# Patient Record
Sex: Female | Born: 1985 | Race: White | Hispanic: No | Marital: Single | Smoking: Never smoker
Health system: Southern US, Community
[De-identification: ages and names within clinical notes are randomized; demographics above are authoritative.]

## PROBLEM LIST (undated history)

## (undated) DIAGNOSIS — Z789 Other specified health status: Secondary | ICD-10-CM

## (undated) HISTORY — PX: TUBAL LIGATION: SHX77

## (undated) HISTORY — DX: Other specified health status: Z78.9

---
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
## 2007-03-15 ENCOUNTER — Observation Stay: Payer: Self-pay | Admitting: Obstetrics and Gynecology

## 2007-04-20 ENCOUNTER — Inpatient Hospital Stay: Payer: Self-pay | Admitting: Obstetrics and Gynecology

## 2009-11-20 ENCOUNTER — Emergency Department: Payer: Self-pay | Admitting: Emergency Medicine

## 2009-12-25 ENCOUNTER — Observation Stay: Payer: Self-pay | Admitting: Obstetrics and Gynecology

## 2009-12-25 ENCOUNTER — Inpatient Hospital Stay: Payer: Self-pay

## 2010-09-29 ENCOUNTER — Emergency Department: Payer: Self-pay | Admitting: Emergency Medicine

## 2011-04-22 ENCOUNTER — Emergency Department: Payer: Self-pay | Admitting: Emergency Medicine

## 2012-03-07 ENCOUNTER — Emergency Department: Payer: Self-pay | Admitting: *Deleted

## 2012-03-07 LAB — COMPREHENSIVE METABOLIC PANEL
Alkaline Phosphatase: 91 U/L (ref 50–136)
Anion Gap: 8 (ref 7–16)
BUN: 7 mg/dL (ref 7–18)
Bilirubin,Total: 0.4 mg/dL (ref 0.2–1.0)
Calcium, Total: 8.6 mg/dL (ref 8.5–10.1)
Co2: 27 mmol/L (ref 21–32)
Creatinine: 0.65 mg/dL (ref 0.60–1.30)
EGFR (African American): >60
Osmolality: 279 (ref 275–301)
Potassium: 3.4 mmol/L — ABNORMAL LOW (ref 3.5–5.1)
SGOT(AST): 14 U/L — ABNORMAL LOW (ref 15–37)
SGPT (ALT): 19 U/L
Total Protein: 8.1 g/dL (ref 6.4–8.2)

## 2012-03-07 LAB — URINALYSIS, COMPLETE
Blood: NEGATIVE
Ketone: NEGATIVE
Nitrite: NEGATIVE
Ph: 6 (ref 4.5–8.0)
Squamous Epithelial: 2
WBC UR: 98 /HPF (ref 0–5)

## 2012-03-07 LAB — CBC
HCT: 42.6 % (ref 35.0–47.0)
HGB: 14 g/dL (ref 12.0–16.0)
MCH: 30.4 pg (ref 26.0–34.0)
MCV: 93 fL (ref 80–100)

## 2012-03-07 LAB — LIPASE, BLOOD: Lipase: 117 U/L (ref 73–393)

## 2012-03-07 LAB — PREGNANCY, URINE: Pregnancy Test, Urine: NEGATIVE m[IU]/mL

## 2012-10-13 ENCOUNTER — Emergency Department: Payer: Self-pay | Admitting: Emergency Medicine

## 2012-10-13 LAB — URINALYSIS, COMPLETE
Bilirubin,UR: NEGATIVE
Glucose,UR: NEGATIVE mg/dL (ref 0–75)
Ph: 5 (ref 4.5–8.0)
RBC,UR: <1 /HPF (ref 0–5)
Specific Gravity: 1.036 (ref 1.003–1.030)
Squamous Epithelial: 5
WBC UR: 3 /HPF (ref 0–5)

## 2012-10-13 LAB — PREGNANCY, URINE: Pregnancy Test, Urine: NEGATIVE m[IU]/mL

## 2013-03-29 ENCOUNTER — Emergency Department: Payer: Self-pay | Admitting: Emergency Medicine

## 2013-03-30 LAB — URINALYSIS, COMPLETE
Bilirubin,UR: NEGATIVE
Glucose,UR: NEGATIVE mg/dL (ref 0–75)
Ketone: NEGATIVE
Ph: 6 (ref 4.5–8.0)
Protein: 100
RBC,UR: 4501 /HPF (ref 0–5)
Squamous Epithelial: 8
WBC UR: 14 /HPF (ref 0–5)

## 2013-03-30 LAB — BASIC METABOLIC PANEL
Anion Gap: 8 (ref 7–16)
BUN: 7 mg/dL (ref 7–18)
Calcium, Total: 8.6 mg/dL (ref 8.5–10.1)
Creatinine: 0.9 mg/dL (ref 0.60–1.30)
EGFR (African American): >60
EGFR (Non-African Amer.): >60
Glucose: 128 mg/dL — ABNORMAL HIGH (ref 65–99)
Potassium: 3.3 mmol/L — ABNORMAL LOW (ref 3.5–5.1)

## 2013-03-30 LAB — CBC
MCHC: 33 g/dL (ref 32.0–36.0)
MCV: 90 fL (ref 80–100)
Platelet: 171 10*3/uL (ref 150–440)
WBC: 10.1 10*3/uL (ref 3.6–11.0)

## 2013-03-30 LAB — HCG, QUANTITATIVE, PREGNANCY: Beta Hcg, Quant.: 31409 m[IU]/mL — ABNORMAL HIGH

## 2013-04-17 ENCOUNTER — Ambulatory Visit: Payer: Self-pay | Admitting: Family Medicine

## 2013-06-21 ENCOUNTER — Encounter: Payer: Self-pay | Admitting: Obstetrics and Gynecology

## 2013-07-11 ENCOUNTER — Ambulatory Visit: Payer: Self-pay | Admitting: Family Medicine

## 2013-09-15 ENCOUNTER — Observation Stay: Payer: Self-pay | Admitting: Obstetrics and Gynecology

## 2013-11-22 ENCOUNTER — Inpatient Hospital Stay: Payer: Self-pay | Admitting: Obstetrics and Gynecology

## 2013-11-22 LAB — CBC WITH DIFFERENTIAL/PLATELET
Basophil #: 0 10*3/uL (ref 0.0–0.1)
Basophil %: 0.4 %
Eosinophil #: 0 10*3/uL (ref 0.0–0.7)
Eosinophil %: 0.4 %
HCT: 36.8 % (ref 35.0–47.0)
HGB: 11.8 g/dL — AB (ref 12.0–16.0)
LYMPHS PCT: 17.5 %
Lymphocyte #: 1.7 10*3/uL (ref 1.0–3.6)
MCH: 25.2 pg — ABNORMAL LOW (ref 26.0–34.0)
MCHC: 32.2 g/dL (ref 32.0–36.0)
MCV: 78 fL — ABNORMAL LOW (ref 80–100)
MONO ABS: 0.4 x10 3/mm (ref 0.2–0.9)
MONOS PCT: 3.9 %
NEUTROS PCT: 77.8 %
Neutrophil #: 7.7 10*3/uL — ABNORMAL HIGH (ref 1.4–6.5)
Platelet: 164 10*3/uL (ref 150–440)
RBC: 4.7 10*6/uL (ref 3.80–5.20)
RDW: 16.2 % — ABNORMAL HIGH (ref 11.5–14.5)
WBC: 9.9 10*3/uL (ref 3.6–11.0)

## 2013-11-23 LAB — HEMOGLOBIN: HGB: 10.6 g/dL — ABNORMAL LOW (ref 12.0–16.0)

## 2014-01-31 ENCOUNTER — Ambulatory Visit: Payer: Self-pay | Admitting: Obstetrics and Gynecology

## 2014-01-31 LAB — PREGNANCY, URINE: Pregnancy Test, Urine: NEGATIVE m[IU]/mL

## 2014-01-31 LAB — HEMOGLOBIN: HGB: 12.6 g/dL (ref 12.0–16.0)

## 2014-02-01 ENCOUNTER — Ambulatory Visit: Payer: Self-pay | Admitting: Obstetrics and Gynecology

## 2014-03-19 ENCOUNTER — Emergency Department: Payer: Self-pay | Admitting: Emergency Medicine

## 2015-02-08 NOTE — Op Note (Signed)
PATIENT NAME:  Kathleen Mcdaniel, Kathleen MR#:  [id number] DATE OF BIRTH:  Dec 04, 1985  DATE OF PROCEDURE:  02/01/2014  PREOPERATIVE DIAGNOSIS: Elective permanent sterilization.   POSTOPERATIVE DIAGNOSIS: Elective permanent sterilization.   PROCEDURE: Laparoscopic bilateral tubal ligation Falope-Rings.   ANESTHESIA: General endotracheal anesthesia.   SURGEON: Suzy Bouchardhomas J. Derrika Ruffalo, MD  INDICATIONS: A 29 year old gravida 5, para 4 patient who is interested in permanent sterilization. The patient is aware of the failure rate of 1 per 200. The patient reconfirms her desire for sterilization prior to taking her to the operating room.   PROCEDURE: After adequate general endotracheal anesthesia, the patient was placed in the dorsal supine position with legs in the [place] stirrups. The patient's abdomen, perineum and vagina were prepped with Betadine and chlorhexidine. The patient was draped in normal sterile fashion. The patient's bladder was catheterized, yielding 200 mL of clear urine. A single-tooth tenaculum was applied on the anterior cervix, and a Kahn cannula was placed in the endocervical canal to be used for uterine manipulation during the procedure. A 12 mm infraumbilical incision was made after injecting with 0.5% Marcaine. The laparoscope was advanced into the abdominal cavity under direct visualization with the Optiview cannula. The patient's abdomen was insufflated with carbon dioxide, and a second port was placed 2 cm above the symphysis pubis. The Falope-Ring trocar was advanced into the abdominal cavity under direct visualization. The patient's right fallopian tube was identified, and a Falope-Ring was placed at the isthmic ampullary portion of fallopian tube. Picture was taken. A similar procedure was repeated on the patient's left fallopian tube. Again, a good 1.5 cm knuckle of fallopian tube was incorporated in the Falope-Ring bilaterally. Picture was taken. There were some small filmy  adhesions on the right sidewall which were not removed. Upper abdomen appeared normal. Good hemostasis was noted. Ovaries appeared normal. The patient's abdomen was deflated, and the infraumbilical incision was closed with a deep 2-0 Vicryl, and all the other incisions were closed with interrupted 4-0 Vicryl sutures. Steri-Strips applied. Sterile dressing applied. The Kahn cannula and the single-tooth tenaculum were removed, and silver nitrate was used at the tenacula sites. Good hemostasis was noted. There were no complications. Estimated blood loss minimal. Intraoperative fluids 700 mL. The patient was taken to the recovery room in good condition.   ____________________________ Suzy Bouchardhomas J. Masayo Fera, MD tjs:lb D: 02/01/2014 09:58:51 ET T: 02/01/2014 10:47:06 ET JOB#: [id number]  cc: Suzy Bouchardhomas J. Kaiyden Simkin, MD, <Dictator> Suzy BouchardHOMAS J Kikue Gerhart MD ELECTRONICALLY SIGNED 02/01/2014 13:16

## 2015-02-25 NOTE — H&P (Signed)
L&D Evaluation:  History:  HPI 29 y/o para 4 at [id number]   Presents with contractions   Patient's Surgical History none   Medications Pre Natal Vitamins   Allergies NKDA   Social History none   Family History Non-Contributory   ROS:  ROS All systems were reviewed.  HEENT, CNS, GI, GU, Respiratory, CV, Renal and Musculoskeletal systems were found to be normal.   Exam:  Vital Signs stable   General no apparent distress   Abdomen gravid, tender with contractions   Estimated Fetal Weight Average for gestational age   Back no CVAT   Pelvic 7/95/-1   FHT normal rate with no decels   Ucx regular   Ucx Frequency 5 min   Impression:  Impression active labor   Plan:  Comments arom clear antic svd GBS +; approx 2 hrs s/p 1st dose Amox   Electronic Signatures: Margaretha GlassingEvans, Ricky L (MD)  (Signed 05-Feb-15 12:18)  Authored: L&D Evaluation   Last Updated: 05-Feb-15 12:18 by Margaretha GlassingEvans, Ricky L (MD)

## 2015-05-27 ENCOUNTER — Encounter: Payer: Self-pay | Admitting: Emergency Medicine

## 2015-05-27 DIAGNOSIS — Z3202 Encounter for pregnancy test, result negative: Secondary | ICD-10-CM | POA: Insufficient documentation

## 2015-05-27 DIAGNOSIS — R1084 Generalized abdominal pain: Secondary | ICD-10-CM | POA: Insufficient documentation

## 2015-05-27 DIAGNOSIS — T475X5A Adverse effect of digestants, initial encounter: Secondary | ICD-10-CM | POA: Insufficient documentation

## 2015-05-27 LAB — COMPREHENSIVE METABOLIC PANEL
ALT: 12 U/L — ABNORMAL LOW (ref 14–54)
AST: 18 U/L (ref 15–41)
Albumin: 4 g/dL (ref 3.5–5.0)
Alkaline Phosphatase: 77 U/L (ref 38–126)
Anion gap: 9 (ref 5–15)
BUN: 14 mg/dL (ref 6–20)
CHLORIDE: 106 mmol/L (ref 101–111)
CO2: 26 mmol/L (ref 22–32)
CREATININE: 0.69 mg/dL (ref 0.44–1.00)
Calcium: 9 mg/dL (ref 8.9–10.3)
GFR calc non Af Amer: >60 mL/min (ref >60)
GLUCOSE: 92 mg/dL (ref 65–99)
POTASSIUM: 3.9 mmol/L (ref 3.5–5.1)
SODIUM: 141 mmol/L (ref 135–145)
TOTAL PROTEIN: 7.3 g/dL (ref 6.5–8.1)
Total Bilirubin: 0.2 mg/dL — ABNORMAL LOW (ref 0.3–1.2)

## 2015-05-27 LAB — LIPASE, BLOOD: LIPASE: 35 U/L (ref 22–51)

## 2015-05-27 LAB — URINALYSIS COMPLETE WITH MICROSCOPIC (ARMC ONLY)
Bacteria, UA: NONE SEEN
Bilirubin Urine: NEGATIVE
GLUCOSE, UA: NEGATIVE mg/dL
HGB URINE DIPSTICK: NEGATIVE
KETONES UR: NEGATIVE mg/dL
NITRITE: NEGATIVE
PH: 6 (ref 5.0–8.0)
PROTEIN: NEGATIVE mg/dL
Specific Gravity, Urine: 1.019 (ref 1.005–1.030)

## 2015-05-27 LAB — CBC
HCT: 40.9 % (ref 35.0–47.0)
HEMOGLOBIN: 13.6 g/dL (ref 12.0–16.0)
MCH: 28.9 pg (ref 26.0–34.0)
MCHC: 33.2 g/dL (ref 32.0–36.0)
MCV: 86.8 fL (ref 80.0–100.0)
Platelets: 145 10*3/uL — ABNORMAL LOW (ref 150–440)
RBC: 4.72 MIL/uL (ref 3.80–5.20)
RDW: 13.9 % (ref 11.5–14.5)
WBC: 8.7 10*3/uL (ref 3.6–11.0)

## 2015-05-27 LAB — POCT PREGNANCY, URINE: PREG TEST UR: NEGATIVE

## 2015-05-27 NOTE — ED Notes (Signed)
Patient ambulatory to triage with steady gait, without difficulty or distress noted pt reports Sunday took garcinia cambogia; since having left lower abd pain with bloating, N/V and HA,

## 2015-05-28 ENCOUNTER — Emergency Department
Admission: EM | Admit: 2015-05-28 | Discharge: 2015-05-28 | Disposition: A | Discharge status: Home / Self Care | Payer: Self-pay | Attending: Emergency Medicine | Admitting: Emergency Medicine

## 2015-05-28 DIAGNOSIS — T50905A Adverse effect of unspecified drugs, medicaments and biological substances, initial encounter: Secondary | ICD-10-CM

## 2015-05-28 DIAGNOSIS — R1084 Generalized abdominal pain: Secondary | ICD-10-CM

## 2015-05-28 MED ORDER — DICYCLOMINE HCL 10 MG PO CAPS: 10.0000 mg | ORAL_CAPSULE | Freq: Three times a day (TID) | ORAL | Status: AC

## 2015-05-28 MED ORDER — DICYCLOMINE HCL 10 MG PO CAPS
10.0000 mg | ORAL_CAPSULE | Freq: Once | ORAL | Status: AC
  Administered 2015-05-28: 10 mg via ORAL
  Filled 2015-05-28: qty 1

## 2015-05-28 NOTE — ED Provider Notes (Signed)
[facility] Emergency Department Provider Note  ____________________________________________  Time seen: 2:00 AM  I have reviewed the triage vital signs and the nursing notes.   HISTORY  Chief Complaint Abdominal Pain and Emesis     HPI Kathleen Mcdaniel is a 29 y.o. female presents with generalized abdominal pain one after taking Garcinia Cambogia on Sunday. Patient also admits to generalized abdominal bloating following taking the tablet for weight loss purposes. Patient denies any fever no nausea or vomiting     Past medical history None There are no active problems to display for this patient.   Past Surgical History  Procedure Laterality Date  . Tubal ligation      No current outpatient prescriptions on file.  Allergies None No family history on file.  Social History Social History  Substance Use Topics  . Smoking status: Never Smoker   . Smokeless tobacco: None  . Alcohol Use: No    Review of Systems  Constitutional: Negative for fever. Eyes: Negative for visual changes. ENT: Negative for sore throat. Cardiovascular: Negative for chest pain. Respiratory: Negative for shortness of breath. Gastrointestinal: Positive abdominal cramps Genitourinary: Negative for dysuria. Musculoskeletal: Negative for back pain. Skin: Negative for rash. Neurological: Negative for headaches, focal weakness or numbness.  10-point ROS otherwise negative.  ____________________________________________   PHYSICAL EXAM:  VITAL SIGNS: ED Triage Vitals  Enc Vitals Group     BP 05/27/15 2128 130/77 mmHg     Pulse Rate 05/27/15 2128 90     Resp 05/27/15 2128 20     Temp 05/27/15 2128 98 F (36.7 C)     Temp Source 05/27/15 2128 Oral     SpO2 05/27/15 2128 100 %     Weight 05/27/15 2128 145 lb (65.772 kg)     Height 05/27/15 2128  (1.676 m)     Head Cir --      Peak Flow --      Pain Score 05/27/15 2128 9     Pain Loc --      Pain  Edu? --      Excl. in GC? --     Constitutional: Alert and oriented. Well appearing and in no distress. Eyes: Conjunctivae are normal. PERRL. Normal extraocular movements. ENT   Head: Normocephalic and atraumatic.   Nose: No congestion/rhinnorhea.   Mouth/Throat: Mucous membranes are moist.   Neck: No stridor. Cardiovascular: Normal rate, regular rhythm. Normal and symmetric distal pulses are present in all extremities. No murmurs, rubs, or gallops. Respiratory: Normal respiratory effort without tachypnea nor retractions. Breath sounds are clear and equal bilaterally. No wheezes/rales/rhonchi. Gastrointestinal: Soft and nontender. No distention. There is no CVA tenderness. Genitourinary: deferred Musculoskeletal: Nontender with normal range of motion in all extremities. No joint effusions.  No lower extremity tenderness nor edema. Neurologic:  Normal speech and language. No gross focal neurologic deficits are appreciated. Speech is normal.  Skin:  Skin is warm, dry and intact. No rash noted. Psychiatric: Mood and affect are normal. Speech and behavior are normal. Patient exhibits appropriate insight and judgment.  ____________________________________________    LABS (pertinent positives/negatives)  Labs Reviewed  COMPREHENSIVE METABOLIC PANEL - Abnormal; Notable for the following:    ALT 12 (*)    Total Bilirubin 0.2 (*)    All other components within normal limits  CBC - Abnormal; Notable for the following:    Platelets 145 (*)    All other components within normal limits  URINALYSIS COMPLETEWITH MICROSCOPIC ([facility] ONLY) -  Abnormal; Notable for the following:    Color, Urine YELLOW (*)    APPearance CLEAR (*)    Leukocytes, UA 1+ (*)    Squamous Epithelial / LPF 0-5 (*)    All other components within normal limits  LIPASE, BLOOD  POC URINE PREG, ED  POCT PREGNANCY, URINE       INITIAL IMPRESSION / ASSESSMENT AND PLAN / ED COURSE  Pertinent labs & imaging  results that were available during my care of the patient were reviewed by me and considered in my medical decision making (see chart for details).  History of physical exam consistent with abdominal discomfort secondary to medication side effect  ____________________________________________   FINAL CLINICAL IMPRESSION(S) / ED DIAGNOSES  Final diagnoses:  Medication side effect, initial encounter  Generalized abdominal cramps      Darci Current, MD 05/28/15 [phone]

## 2015-05-28 NOTE — Discharge Instructions (Signed)

## 2015-07-13 ENCOUNTER — Emergency Department (HOSPITAL_COMMUNITY)
Admission: EM | Admit: 2015-07-13 | Discharge: 2015-07-13 | Disposition: A | Discharge status: Home / Self Care | Payer: Self-pay | Attending: Emergency Medicine | Admitting: Emergency Medicine

## 2015-07-13 ENCOUNTER — Encounter (HOSPITAL_COMMUNITY): Payer: Self-pay | Admitting: *Deleted

## 2015-07-13 DIAGNOSIS — R05 Cough: Secondary | ICD-10-CM | POA: Insufficient documentation

## 2015-07-13 DIAGNOSIS — R1110 Vomiting, unspecified: Secondary | ICD-10-CM | POA: Insufficient documentation

## 2015-07-13 DIAGNOSIS — R1084 Generalized abdominal pain: Secondary | ICD-10-CM | POA: Insufficient documentation

## 2015-07-13 DIAGNOSIS — Z79899 Other long term (current) drug therapy: Secondary | ICD-10-CM | POA: Insufficient documentation

## 2015-07-13 DIAGNOSIS — Z3202 Encounter for pregnancy test, result negative: Secondary | ICD-10-CM | POA: Insufficient documentation

## 2015-07-13 DIAGNOSIS — N898 Other specified noninflammatory disorders of vagina: Secondary | ICD-10-CM | POA: Insufficient documentation

## 2015-07-13 LAB — URINALYSIS, ROUTINE W REFLEX MICROSCOPIC
Bilirubin Urine: NEGATIVE
GLUCOSE, UA: NEGATIVE mg/dL
HGB URINE DIPSTICK: NEGATIVE
Ketones, ur: NEGATIVE mg/dL
Leukocytes, UA: NEGATIVE
Nitrite: NEGATIVE
PH: 6.5 (ref 5.0–8.0)
PROTEIN: NEGATIVE mg/dL
Urobilinogen, UA: 0.2 mg/dL (ref 0.0–1.0)

## 2015-07-13 LAB — PREGNANCY, URINE: PREG TEST UR: NEGATIVE

## 2015-07-13 LAB — WET PREP, GENITAL
Trich, Wet Prep: NONE SEEN
Yeast Wet Prep HPF POC: NONE SEEN

## 2015-07-13 MED ORDER — ACETAMINOPHEN 325 MG PO TABS
650.0000 mg | ORAL_TABLET | Freq: Once | ORAL | Status: AC
  Administered 2015-07-13: 650 mg via ORAL
  Filled 2015-07-13: qty 2

## 2015-07-13 MED ORDER — ONDANSETRON 8 MG PO TBDP
8.0000 mg | ORAL_TABLET | Freq: Once | ORAL | Status: AC
  Administered 2015-07-13: 8 mg via ORAL
  Filled 2015-07-13: qty 1

## 2015-07-13 NOTE — ED Notes (Signed)
Pelvic being done by EDP

## 2015-07-13 NOTE — Discharge Instructions (Signed)

## 2015-07-13 NOTE — ED Provider Notes (Signed)
CSN: [id number]     Arrival date & time 07/13/15  1153 History  This chart was scribed for Zadie Rhine, MD by Murriel Hopper, ED Scribe. This patient was seen in room APA01/APA01 and the patient's care was started at 12:26 PM.    Chief Complaint  Patient presents with  . Abdominal Pain      The history is provided by the patient. No language interpreter was used.    HPI Comments: Kathleen Mcdaniel is a 29 y.o. female who presents to the Emergency Department complaining of constant, cramping lower abdominal pain with associated odorous clear vaginal discharge and vomiting that has been present for 5-6 days. Pt states she began vomiting two days ago. Pt states the amount of discharge she is having is normal. Pt reports her last period was a week and a half ago. Pt reports hx of chlamydia a few years ago and states she is sexually active with one partner and she currently has no concerns for STD. Pt denies dysuria, diarrhea. Pt also reports she has been coughing intermittently in the last couple of days.    PMH - none  Past Surgical History  Procedure Laterality Date  . Tubal ligation     No family history on file. Social History  Substance Use Topics  . Smoking status: Never Smoker   . Smokeless tobacco: None  . Alcohol Use: No   OB History    No data available     Review of Systems  Respiratory: Positive for cough.   Gastrointestinal: Positive for vomiting and abdominal pain. Negative for diarrhea.  Genitourinary: Positive for vaginal discharge. Negative for dysuria.  All other systems reviewed and are negative.     Allergies  Review of patient's allergies indicates no known allergies.  Home Medications   Prior to Admission medications   Medication Sig Start Date End Date Taking? Authorizing Provider  dicyclomine (BENTYL) 10 MG capsule Take 1 capsule (10 mg total) by mouth 3 (three) times daily before meals. 05/28/15   Darci Current, MD   BP 112/83 mmHg   Pulse 81  Temp(Src) 97.9 F (36.6 C) (Oral)  Resp 18  Ht  (1.651 m)  Wt 140 lb (63.504 kg)  BMI 23.30 kg/m2  SpO2 98%  LMP 06/29/2015 Physical Exam  Nursing note and vitals reviewed.  CONSTITUTIONAL: Well developed/well nourished HEAD: Normocephalic/atraumatic EYES: EOMI/PERRL ENMT: Mucous membranes moist NECK: supple no meningeal signs SPINE/BACK diffuse lumbar paraspinal tenderness CV: S1/S2 noted, no murmurs/rubs/gallops noted LUNGS: Lungs are clear to auscultation bilaterally, no apparent distress ABDOMEN: soft, nontender, no rebound or guarding, bowel sounds noted throughout abdomen GU:no cva tenderness. Yellowish/white discharge noted, no cmt, no adnexal tenderness/mass.  Female chaperone present NEURO: Pt is awake/alert/appropriate, moves all extremitiesx4.  No facial droop.   EXTREMITIES: pulses normal/equal, full ROM SKIN: warm, color normal PSYCH: no abnormalities of mood noted, alert and oriented to situation   ED Course  Procedures   DIAGNOSTIC STUDIES: Oxygen Saturation is 98% on room air, normal by my interpretation.    COORDINATION OF CARE: 12:27 PM Discussed treatment plan with pt at bedside and pt agreed to plan.   Labs Review Labs Reviewed  WET PREP, GENITAL - Abnormal; Notable for the following:    Clue Cells Wet Prep HPF POC FEW (*)    WBC, Wet Prep HPF POC FEW (*)    All other components within normal limits  URINALYSIS, ROUTINE W REFLEX MICROSCOPIC (NOT AT [facility]) - Abnormal; Notable for  the following:    Specific Gravity, Urine <1.005 (*)    All other components within normal limits  PREGNANCY, URINE  POC URINE PREG, ED  GC/CHLAMYDIA PROBE AMP ([place]) NOT AT [facility]    I have personally reviewed and evaluated these lab results as part of my medical decision-making.   Pt well appearing Advised f/u with GYN Advised to avoid sexual contact until all symptoms resolved and labs tests are negative    Medications  ondansetron  (ZOFRAN-ODT) disintegrating tablet 8 mg (8 mg Oral Given 07/13/15 1231)  acetaminophen (TYLENOL) tablet 650 mg (650 mg Oral Given 07/13/15 1231)    MDM   Final diagnoses:  None    Nursing notes including past medical history and social history reviewed and considered in documentation Labs/vital reviewed myself and considered during evaluation   I personally performed the services described in this documentation, which was scribed in my presence. The recorded information has been reviewed and is accurate.      Zadie Rhine, MD 07/13/15 1339

## 2015-07-13 NOTE — ED Notes (Addendum)
Pt states lower abdominal pain, described as cramping. Pt states she has also noticed malodorous vaginal discharge. States discharge is clear but there is "a lot" of it. Denies itching or irritation. States vomiting began two days ago.

## 2015-07-14 LAB — GC/CHLAMYDIA PROBE AMP (~~LOC~~) NOT AT ARMC
Chlamydia: NEGATIVE
Neisseria Gonorrhea: NEGATIVE

## 2015-08-02 ENCOUNTER — Emergency Department (HOSPITAL_COMMUNITY)
Admission: EM | Admit: 2015-08-02 | Discharge: 2015-08-02 | Disposition: A | Discharge status: Home / Self Care | Payer: Self-pay | Attending: Emergency Medicine | Admitting: Emergency Medicine

## 2015-08-02 ENCOUNTER — Emergency Department (HOSPITAL_COMMUNITY): Payer: Self-pay

## 2015-08-02 ENCOUNTER — Encounter (HOSPITAL_COMMUNITY): Payer: Self-pay | Admitting: Emergency Medicine

## 2015-08-02 DIAGNOSIS — Z9851 Tubal ligation status: Secondary | ICD-10-CM | POA: Insufficient documentation

## 2015-08-02 DIAGNOSIS — Z3202 Encounter for pregnancy test, result negative: Secondary | ICD-10-CM | POA: Insufficient documentation

## 2015-08-02 DIAGNOSIS — R1084 Generalized abdominal pain: Secondary | ICD-10-CM | POA: Insufficient documentation

## 2015-08-02 DIAGNOSIS — R109 Unspecified abdominal pain: Secondary | ICD-10-CM

## 2015-08-02 LAB — CBC WITH DIFFERENTIAL/PLATELET
Basophils Absolute: 0 10*3/uL (ref 0.0–0.1)
Basophils Relative: 1 %
EOS PCT: 1 %
Eosinophils Absolute: 0.1 10*3/uL (ref 0.0–0.7)
HEMATOCRIT: 42.7 % (ref 36.0–46.0)
Hemoglobin: 14 g/dL (ref 12.0–15.0)
LYMPHS PCT: 20 %
Lymphs Abs: 1.7 10*3/uL (ref 0.7–4.0)
MCH: 29.8 pg (ref 26.0–34.0)
MCHC: 32.8 g/dL (ref 30.0–36.0)
MCV: 90.9 fL (ref 78.0–100.0)
MONOS PCT: 7 %
Monocytes Absolute: 0.6 10*3/uL (ref 0.1–1.0)
NEUTROS ABS: 5.9 10*3/uL (ref 1.7–7.7)
Neutrophils Relative %: 71 %
PLATELETS: 183 10*3/uL (ref 150–400)
RBC: 4.7 MIL/uL (ref 3.87–5.11)
RDW: 14.2 % (ref 11.5–15.5)
WBC: 8.2 10*3/uL (ref 4.0–10.5)

## 2015-08-02 LAB — COMPREHENSIVE METABOLIC PANEL
ALT: 13 U/L — ABNORMAL LOW (ref 14–54)
AST: 15 U/L (ref 15–41)
Albumin: 4.3 g/dL (ref 3.5–5.0)
Alkaline Phosphatase: 66 U/L (ref 38–126)
Anion gap: 7 (ref 5–15)
BUN: 14 mg/dL (ref 6–20)
CHLORIDE: 106 mmol/L (ref 101–111)
CO2: 29 mmol/L (ref 22–32)
Calcium: 9.2 mg/dL (ref 8.9–10.3)
Creatinine, Ser: 0.74 mg/dL (ref 0.44–1.00)
GFR calc Af Amer: >60 mL/min (ref >60)
GFR calc non Af Amer: >60 mL/min (ref >60)
GLUCOSE: 88 mg/dL (ref 65–99)
POTASSIUM: 3.6 mmol/L (ref 3.5–5.1)
Sodium: 142 mmol/L (ref 135–145)
Total Bilirubin: 0.8 mg/dL (ref 0.3–1.2)
Total Protein: 7.8 g/dL (ref 6.5–8.1)

## 2015-08-02 LAB — URINALYSIS, ROUTINE W REFLEX MICROSCOPIC
Bilirubin Urine: NEGATIVE
Glucose, UA: NEGATIVE mg/dL
HGB URINE DIPSTICK: NEGATIVE
Ketones, ur: NEGATIVE mg/dL
Nitrite: NEGATIVE
PROTEIN: NEGATIVE mg/dL
SPECIFIC GRAVITY, URINE: 1.025 (ref 1.005–1.030)
Urobilinogen, UA: 0.2 mg/dL (ref 0.0–1.0)
pH: 6 (ref 5.0–8.0)

## 2015-08-02 LAB — URINE MICROSCOPIC-ADD ON

## 2015-08-02 LAB — POC URINE PREG, ED: Preg Test, Ur: NEGATIVE

## 2015-08-02 LAB — LIPASE, BLOOD: Lipase: 37 U/L (ref 22–51)

## 2015-08-02 MED ORDER — IOHEXOL 300 MG/ML  SOLN
100.0000 mL | Freq: Once | INTRAMUSCULAR | Status: AC | PRN
  Administered 2015-08-02: 100 mL via INTRAVENOUS

## 2015-08-02 MED ORDER — ONDANSETRON HCL 4 MG/2ML IJ SOLN
4.0000 mg | INTRAMUSCULAR | Status: DC | PRN
  Administered 2015-08-02: 4 mg via INTRAVENOUS
  Filled 2015-08-02: qty 2

## 2015-08-02 MED ORDER — SODIUM CHLORIDE 0.9 % IV BOLUS (SEPSIS)
1000.0000 mL | Freq: Once | INTRAVENOUS | Status: AC
  Administered 2015-08-02: 1000 mL via INTRAVENOUS

## 2015-08-02 MED ORDER — ONDANSETRON HCL 4 MG PO TABS: 4.0000 mg | ORAL_TABLET | Freq: Three times a day (TID) | ORAL | Status: AC | PRN

## 2015-08-02 MED ORDER — IOHEXOL 300 MG/ML  SOLN
25.0000 mL | Freq: Once | INTRAMUSCULAR | Status: AC | PRN
  Administered 2015-08-02: 25 mL via ORAL

## 2015-08-02 MED ORDER — DICYCLOMINE HCL 10 MG/ML IM SOLN
20.0000 mg | Freq: Once | INTRAMUSCULAR | Status: AC
  Administered 2015-08-02: 20 mg via INTRAMUSCULAR
  Filled 2015-08-02: qty 2

## 2015-08-02 MED ORDER — DICYCLOMINE HCL 20 MG PO TABS: 20.0000 mg | ORAL_TABLET | Freq: Four times a day (QID) | ORAL | Status: AC | PRN

## 2015-08-02 NOTE — Discharge Instructions (Signed)
°Emergency Department Resource Guide °1) Find a Doctor and Pay Out of Pocket °Although you won't have to find out who is covered by your insurance plan, it is a good idea to ask around and get recommendations. You will then need to call the office and see if the doctor you have chosen will accept you as a new patient and what types of options they offer for patients who are self-pay. Some doctors offer discounts or will set up payment plans for their patients who do not have insurance, but you will need to ask so you aren't surprised when you get to your appointment. ° °2) Contact Your Local Health Department °Not all health departments have doctors that can see patients for sick visits, but many do, so it is worth a call to see if yours does. If you don't know where your local health department is, you can check in your phone book. The CDC also has a tool to help you locate your state's health department, and many state websites also have listings of all of their local health departments. ° °3) Find a Walk-in Clinic °If your illness is not likely to be very severe or complicated, you may want to try a walk in clinic. These are popping up all over the country in pharmacies, drugstores, and shopping centers. They're usually staffed by nurse practitioners or physician assistants that have been trained to treat common illnesses and complaints. They're usually fairly quick and inexpensive. However, if you have serious medical issues or chronic medical problems, these are probably not your best option. ° °No Primary Care Doctor: °- Call [facility] at  832-8000 - they can help you locate a primary care doctor that  accepts your insurance, provides certain services, etc. °- Physician Referral Service- 1-800-533-3463 ° °Chronic Pain Problems: °Organization         Address  Phone   Notes  °[facility]  ([phone]) [phone] Patients need to be referred by their primary care doctor.  ° °Medication  Assistance: °Organization         Address  Phone   Notes  °[place] [facility] [address] °[place], [place] [address] (336) 641-8030 --Must be a resident of [place] °-- Must have NO insurance coverage whatsoever (no Medicaid/ Medicare, etc.) °-- The pt. MUST have a primary care doctor that directs their care regularly and follows them in the community °  °[facility]  (866) 331-1348   °[employer]  (888) 892-1162   ° °Agencies that provide inexpensive medical care: °Organization         Address  Phone   Notes  °[facility]  (336) 832-8035   °[facility]    (336) 832-7272   °[facility] [address] °[place], [place] [address] (336) 832-4777   °[facility] [address], °[place] (336) 271-4999   °[facility]    (336) 373-0678   °[facility]    (336) 272-1050   °[facility] ° [address], [place] Phone:  (336) 832-4444, Fax:  (336) 832-4440 Hours of Operation:  9 am - 6 pm, M-F.  Also accepts Medicaid/Medicare and self-pay.  °[facility] ° [address], [place] Phone: (336) [phone], Fax: (336) [phone]. Hours of Operation:  8:30 am - 5:30 pm, M-F.  Also accepts Medicaid and self-pay.  °[facility] [place] [address], [place] Phone: (336) 878-6027   °[facility] [address], [place], [place] (336)723-1848, Ext. 123 Mondays & Thursdays: 7-9 AM.  First 15 patients are seen on a first come, first serve basis. °  ° °Medicaid-accepting [place] Providers: ° °Organization         Address  Phone   Notes  °[facility] [address], [place] (336) 641-2100 Also accepts self-pay patients.  °[facility] [address], [place] ° (336) 856-9996   °[facility] [address], [place]  (336) 288-8857   °[facility] [address] [place] [address], [place] (336) 299-7000   °Veita Bland [address], [place]  ° (336) 373-1557 Only accepts [place] Access Medicaid patients after they have their name applied to their card.  ° °Self-Pay (no insurance) in [place]: ° °Organization         Address  Phone   Notes  °Sickle Cell Patients, [facility] [address], [place] (336) 832-1970   °[facility] [address], [place] (336) 832-4400   °[facility] ° [address], [place] (336) 992-4800   °[facility]/Dr. Osei-Bonsu ° [address] [place] [address], [place] or [address], [place] (336) [phone] Phone number for both [place] and [place] locations is the same.  °[facility] [address], [place] (336) 299-0000   °[facility] [address] [place] [address], [place] or [address] (336) 852-7530 °(336) 878-2260   °[facility] [address], [place] (336) 350-1642, phone; (336) 294-5005, fax Sees patients 1st and 3rd Saturday of every month.  Must not qualify for public or private insurance (i.e. Medicaid, Medicare, [place] Health Choice, Veterans' Benefits) • Household income should be no more than 200% of the poverty level •The clinic cannot treat you if you are pregnant or think you are pregnant • Sexually transmitted diseases are not treated at the clinic.  ° ° °Dental Care: °Organization         Address  Phone  Notes  °[place] [facility] [address], [place] (336) 641-6152 Accepts children up to age 21 who are enrolled in Medicaid or [place] Health Choice; pregnant women with a Medicaid card; and children who have applied for Medicaid or [place] Health Choice, but were declined, whose parents can pay a reduced fee at time of service.  °[place]  [facility] [place]  [address], [place] (336) 641-7733 Accepts children up to age 21 who are enrolled in Medicaid or [place] Health Choice; pregnant women with a Medicaid card; and children who have applied for Medicaid or [place] Health Choice, but were declined, whose parents can pay a reduced fee at time of service.  °[facility] ° [address], [place] (336) 641-4533 Patients are seen by appointment only. Walk-ins are not accepted. [facility] will see patients 18 years of age and older. °Monday - Tuesday (8am-5pm) °Most Wednesdays (8:30-5pm) °$30 per visit, cash only  °[facility] ° [address], [place] (336) 641-4533 Patients are seen by appointment only. Walk-ins are not accepted. [facility] will see patients 18 years of age and older. °One   Wednesday Evening (Monthly: Volunteer Based).  $30 per visit, cash only  °[facility]  (919) [phone] for adults; Children under age 4, call Graduate Pediatric Dentistry at ([phone]) [phone]. Children aged 4-14, please call ([phone]) [phone] to request a pediatric application. ° Dental services are provided in all areas of dental care including fillings, crowns and bridges, complete and partial dentures, implants, gum treatment, root canals, and extractions. Preventive care is also provided. Treatment is provided to both adults and children. °Patients are selected via a lottery and there is often a waiting list. °  °[facility] [address], °[place] ° (336) 763-8833 [email or web address] °  °[facility] [address], [place], [place] (336)723-1848, Ext. 123 Second and Fourth Thursday of each month, opens at 6:30 AM; Clinic ends at 9 AM.  Patients are seen on a first-come first-served basis, and a limited number are seen during each clinic.  ° °[facility] ° [address], [place], [place] (336) 723-7904    Eligibility Requirements °You must have lived in [place], [place], or [place] counties for at least the last three months. °  You cannot be eligible for state or federal sponsored healthcare insurance, including Veterans Administration, Medicaid, or Medicare. °  You generally cannot be eligible for healthcare insurance through your employer.  °  How to apply: °Eligibility screenings are held every Tuesday and Wednesday afternoon from 1:00 pm until 4:00 pm. You do not need an appointment for the interview!  °[facility] [address], [place], [place] 336-631-2330   °[facility]  336-342-8273   °[facility]  336-703-3100   °[facility]  336-570-6415   ° °Behavioral Health Resources in the Community: °Intensive Outpatient Programs °Organization         Address  Phone  Notes  °[facility] [address], [place], [place] 336-878-6098   °[facility] [address], [place], [place] 336-832-9800   °[facility] [address], [place], [place] ° 336-882-2125   °[facility] [address],  °[place], [place] 1-800-853-5163 or 336-641-4981   °Substance Abuse Resources °Organization         Address  Phone  Notes  °[facility]  336-882-2125   °[facility]  336-784-9470   °[facility]  336-285-9073   °[facility]  336-845-3988   °Residential & Outpatient Substance Abuse Program  1-800-659-3381   °Psychological Services °Organization         Address  Phone  Notes  °[facility]  336- 832-9600   °[facility]  336- 378-7881   °[facility] [address], [place] 1-800-853-5163 or 336-641-4981   ° °Mobile Crisis Teams °Organization         Address  Phone  Notes  °[facility], Mobile Crisis Care Unit  1-877-626-1772   °Assertive °[facility] ° [address]  [place], [place] 336-834-9664   °Sharon DeEsch [address] °[place] 336-554-5454   ° °Self-Help/Support Groups °Organization         Address  Phone             Notes  °[facility]  - variety of support groups  336- 373-1402 Call for more information  °Narcotics Anonymous (NA), [facility] [address], °[place]  2 meetings at this location  ° °  Residential Treatment Programs Organization         Address  Phone  Notes  [facility] [address],    [place]  [phone]   [facility]  [address], [place] [phone], [place], [place] [phone]   [facility] [address], [place] [phone] Admissions: 8am-3pm M-F  [facility] [address]    [place], [place] [phone]   [facility] [address], [place], [place] [phone]   [facility] [address]  [place], [place] [phone]   [facility] Intensive Outpatient [address], [place], [place] [phone]   [facility] [address]  [place], [place] [phone] or [phone]   [facility] ([facility] [address] [place], [place] [phone] Accepts Medicaid  [facility] [address]  [place] [phone] Substance Abuse/Addiction Treatment   [facility]         Address  Phone  Notes  [facility]  ([phone])[phone](888) [phone]   Angie FavaJulie Brannon, PhD [address], [address] [place], [place]   [phone]) [phone] or [phone](336) [phone]   [facility]   [address] [place], [place] [phone]) [phone]   [facility] [address], [place], [place] [phone](336) [phone] Insurance/Medicaid/sponsorship through [facility]  [facility] [address]                                    [place], [place] [phone]) [phone] Therapy/tele-psych/case    [facility] [address]   [place], [place] [phone](336) [phone]    Dr. Lolly MustacheArfeen  [phone]) [phone]   [facility] [place]  [employer] [facility] 1) [address], [place] 2) [address], [place] 3)  [address], [place] [phone](336) [phone]) [phone]  [phone]) [phone]   [facility] Child Abuse Hotline 631-888-2651(336) [phone] or [phone](336) [phone] (After Hours)      Take the prescriptions as directed.  Call your regular medical doctor, your OB/GYN doctor, and the GI doctor on Monday to schedule a follow up appointment within the next week.  Return to the Emergency Department immediately sooner if worsening.

## 2015-08-02 NOTE — ED Notes (Signed)
Pt reports upper and lower abd pain x 2 weeks with nausea.

## 2015-08-02 NOTE — ED Provider Notes (Signed)
CSN: [id number]     Arrival date & time 08/02/15  1846 History   First MD Initiated Contact with Patient 08/02/15 1855     Chief Complaint  Patient presents with  . Abdominal Pain      HPI  Pt was seen at 1920.  Per pt, c/o gradual onset and persistence of constant generalized abd "pain" for the past 2 weeks.  Has been associated with multiple intermittent episodes of N/V.  Describes the abd pain as "cramping."  Denies diarrhea, no fevers, no back pain, no rash, no CP/SOB, no black or blood in stools or emesis. The symptoms have been associated with no other complaints. The patient has a significant history of similar symptoms previously, recently being evaluated for this complaint and multiple prior evals for same. This is pt's 3rd ED visit in 3 months for abd pain complaint.    History reviewed. No pertinent past medical history.   Past Surgical History  Procedure Laterality Date  . Tubal ligation      Social History  Substance Use Topics  . Smoking status: Never Smoker   . Smokeless tobacco: None  . Alcohol Use: No    Review of Systems ROS: Statement: All systems negative except as marked or noted in the HPI; Constitutional: Negative for fever and chills. ; ; Eyes: Negative for eye pain, redness and discharge. ; ; ENMT: Negative for ear pain, hoarseness, nasal congestion, sinus pressure and sore throat. ; ; Cardiovascular: Negative for chest pain, palpitations, diaphoresis, dyspnea and peripheral edema. ; ; Respiratory: Negative for cough, wheezing and stridor. ; ; Gastrointestinal: +N/V, abd pain. Negative for diarrhea, blood in stool, hematemesis, jaundice and rectal bleeding. . ; ; Genitourinary: Negative for dysuria, flank pain and hematuria. ; ; Musculoskeletal: Negative for back pain and neck pain. Negative for swelling and trauma.; ; Skin: Negative for pruritus, rash, abrasions, blisters, bruising and skin lesion.; ; Neuro: Negative for headache, lightheadedness and neck  stiffness. Negative for weakness, altered level of consciousness , altered mental status, extremity weakness, paresthesias, involuntary movement, seizure and syncope.      Allergies  Review of patient's allergies indicates no known allergies.  Home Medications   Prior to Admission medications   Medication Sig Start Date End Date Taking? Authorizing Provider  dicyclomine (BENTYL) 10 MG capsule Take 1 capsule (10 mg total) by mouth 3 (three) times daily before meals. Patient not taking: Reported on 07/13/2015 05/28/15   Darci Current, MD   BP 122/81 mmHg  Pulse 86  Temp(Src) 97.7 F (36.5 C) (Oral)  Resp 16  Ht  (1.651 m)  Wt 140 lb (63.504 kg)  BMI 23.30 kg/m2  SpO2 99%  LMP 07/19/2015 (Exact Date) Physical Exam  1925: Physical examination:  Nursing notes reviewed; Vital signs and O2 SAT reviewed;  Constitutional: Well developed, Well nourished, Well hydrated, In no acute distress; Head:  Normocephalic, atraumatic; Eyes: EOMI, PERRL, No scleral icterus; ENMT: Mouth and pharynx normal, Mucous membranes moist; Neck: Supple, Full range of motion, No lymphadenopathy; Cardiovascular: Regular rate and rhythm, No murmur, rub, or gallop; Respiratory: Breath sounds clear & equal bilaterally, No rales, rhonchi, wheezes.  Speaking full sentences with ease, Normal respiratory effort/excursion; Chest: Nontender, Movement normal; Abdomen: Soft, +mild diffuse tenderness to palp. No rebound or guarding. Nondistended, Normal bowel sounds; Genitourinary: No CVA tenderness; Extremities: Pulses normal, No tenderness, No edema, No calf edema or asymmetry.; Neuro: AA&Ox3, Major CN grossly intact.  Speech clear. No gross focal motor or sensory  deficits in extremities.; Skin: Color normal, Warm, Dry.   ED Course  Procedures (including critical care time) Labs Review   Imaging Review  I have personally reviewed and evaluated these images and lab results as part of my medical decision-making.   EKG  Interpretation None      MDM  MDM Reviewed: previous chart, nursing note and vitals Reviewed previous: labs Interpretation: labs and CT scan     Results for orders placed or performed during the hospital encounter of 08/02/15  Comprehensive metabolic panel  Result Value Ref Range   Sodium 142 135 - 145 mmol/L   Potassium 3.6 3.5 - 5.1 mmol/L   Chloride 106 101 - 111 mmol/L   CO2 29 22 - 32 mmol/L   Glucose, Bld 88 65 - 99 mg/dL   BUN 14 6 - 20 mg/dL   Creatinine, Ser 2.95 0.44 - 1.00 mg/dL   Calcium 9.2 8.9 - 62.1 mg/dL   Total Protein 7.8 6.5 - 8.1 g/dL   Albumin 4.3 3.5 - 5.0 g/dL   AST 15 15 - 41 U/L   ALT 13 (L) 14 - 54 U/L   Alkaline Phosphatase 66 38 - 126 U/L   Total Bilirubin 0.8 0.3 - 1.2 mg/dL   GFR calc non Af Amer >60 >60 mL/min   GFR calc Af Amer >60 >60 mL/min   Anion gap 7 5 - 15  Lipase, blood  Result Value Ref Range   Lipase 37 22 - 51 U/L  CBC with Differential  Result Value Ref Range   WBC 8.2 4.0 - 10.5 K/uL   RBC 4.70 3.87 - 5.11 MIL/uL   Hemoglobin 14.0 12.0 - 15.0 g/dL   HCT 30.8 65.7 - 84.6 %   MCV 90.9 78.0 - 100.0 fL   MCH 29.8 26.0 - 34.0 pg   MCHC 32.8 30.0 - 36.0 g/dL   RDW 96.2 95.2 - 84.1 %   Platelets 183 150 - 400 K/uL   Neutrophils Relative % 71 %   Neutro Abs 5.9 1.7 - 7.7 K/uL   Lymphocytes Relative 20 %   Lymphs Abs 1.7 0.7 - 4.0 K/uL   Monocytes Relative 7 %   Monocytes Absolute 0.6 0.1 - 1.0 K/uL   Eosinophils Relative 1 %   Eosinophils Absolute 0.1 0.0 - 0.7 K/uL   Basophils Relative 1 %   Basophils Absolute 0.0 0.0 - 0.1 K/uL  Urinalysis, Routine w reflex microscopic  Result Value Ref Range   Color, Urine YELLOW YELLOW   APPearance HAZY (A) CLEAR   Specific Gravity, Urine 1.025 1.005 - 1.030   pH 6.0 5.0 - 8.0   Glucose, UA NEGATIVE NEGATIVE mg/dL   Hgb urine dipstick NEGATIVE NEGATIVE   Bilirubin Urine NEGATIVE NEGATIVE   Ketones, ur NEGATIVE NEGATIVE mg/dL   Protein, ur NEGATIVE NEGATIVE mg/dL    Urobilinogen, UA 0.2 0.0 - 1.0 mg/dL   Nitrite NEGATIVE NEGATIVE   Leukocytes, UA MODERATE (A) NEGATIVE  Urine microscopic-add on  Result Value Ref Range   Squamous Epithelial / LPF MANY (A) RARE   WBC, UA 3-6 <3 WBC/hpf   Bacteria, UA MANY (A) RARE  POC urine preg, ED (not at [facility])  Result Value Ref Range   Preg Test, Ur NEGATIVE NEGATIVE   Ct Abdomen Pelvis W Contrast 08/02/2015  CLINICAL DATA:  Sharp intermittent upper abdominal pain with more constant bilateral lower abdominal pain for 2 weeks. There is associated nausea. EXAM: CT ABDOMEN AND PELVIS WITH CONTRAST TECHNIQUE:  Multidetector CT imaging of the abdomen and pelvis was performed using the standard protocol following bolus administration of intravenous contrast. CONTRAST:  25mL OMNIPAQUE IOHEXOL 300 MG/ML SOLN, 100mL OMNIPAQUE IOHEXOL 300 MG/ML SOLN COMPARISON:  None. FINDINGS: Lung bases:  Clear.  Heart normal in size. Liver: Small low-density area in the left lobe adjacent to the falciform ligament. This may reflect focal fat or a small cyst. Is nonspecific. Tiny low-density lesion in the posterior segment right lobe, also nonspecific but possibly a cyst. No other liver abnormality. Spleen, gallbladder, pancreas, adrenal glands:  Unremarkable. Kidneys, ureters, bladder:  Normal. Uterus and adnexa: Uterus unremarkable. No adnexal masses. There are serpiginous low-attenuation structures on each side of the lower uterus and upper vagina consistent with unenhanced prominent periuterine/perivaginal vessels. Mild prominence of the gonadal veins, right greater than left. Lymph nodes:  No adenopathy. Ascites:  None. Gastrointestinal:  Unremarkable.  Normal appendix seen. Musculoskeletal:  Unremarkable. IMPRESSION: 1. No acute abnormalities within the abdomen pelvis. 2. There are prominent periuterine perivaginal vessels, which appear to be predominantly unenhanced veins. This is nonspecific. Pelvic congestion syndrome should be considered in the  proper clinical setting. This may be a normal variation in this patient. 3. No other significant findings.  Normal appendix visualized. Electronically Signed   By: Amie Portlandavid  Ormond M.D.   On: 08/02/2015 20:36    2100:  Pt has tol PO well while in the ED without N/V.  No stooling while in the ED.  Abd benign, VSS. Feels better and wants to go home now. Udip contaminated. Workup reassuring. Tx symptomatically at this time. Dx and testing d/w pt and family.  Questions answered.  Verb understanding, agreeable to d/c home with outpt f/u.    Samuel JesterKathleen Jackline Castilla, DO 08/06/15 2058

## 2015-12-17 ENCOUNTER — Emergency Department
Admission: EM | Admit: 2015-12-17 | Discharge: 2015-12-17 | Disposition: A | Discharge status: Home / Self Care | Payer: Self-pay | Attending: Emergency Medicine | Admitting: Emergency Medicine

## 2015-12-17 ENCOUNTER — Encounter: Payer: Self-pay | Admitting: Emergency Medicine

## 2015-12-17 DIAGNOSIS — Z3202 Encounter for pregnancy test, result negative: Secondary | ICD-10-CM | POA: Insufficient documentation

## 2015-12-17 LAB — URINALYSIS COMPLETE WITH MICROSCOPIC (ARMC ONLY)
BILIRUBIN URINE: NEGATIVE
GLUCOSE, UA: NEGATIVE mg/dL
KETONES UR: NEGATIVE mg/dL
LEUKOCYTES UA: NEGATIVE
NITRITE: NEGATIVE
Protein, ur: NEGATIVE mg/dL
SPECIFIC GRAVITY, URINE: 1.025 (ref 1.005–1.030)
pH: 5 (ref 5.0–8.0)

## 2015-12-17 LAB — COMPREHENSIVE METABOLIC PANEL
ALT: 15 U/L (ref 14–54)
ANION GAP: 6 (ref 5–15)
AST: 17 U/L (ref 15–41)
Albumin: 4.1 g/dL (ref 3.5–5.0)
Alkaline Phosphatase: 73 U/L (ref 38–126)
BUN: 12 mg/dL (ref 6–20)
CHLORIDE: 107 mmol/L (ref 101–111)
CO2: 29 mmol/L (ref 22–32)
CREATININE: 0.68 mg/dL (ref 0.44–1.00)
Calcium: 8.8 mg/dL — ABNORMAL LOW (ref 8.9–10.3)
GFR calc non Af Amer: >60 mL/min (ref >60)
Glucose, Bld: 110 mg/dL — ABNORMAL HIGH (ref 65–99)
POTASSIUM: 3.9 mmol/L (ref 3.5–5.1)
SODIUM: 142 mmol/L (ref 135–145)
Total Bilirubin: 0.6 mg/dL (ref 0.3–1.2)
Total Protein: 7.6 g/dL (ref 6.5–8.1)

## 2015-12-17 LAB — ABO/RH: ABO/RH(D): B POS

## 2015-12-17 LAB — CBC
HCT: 43.6 % (ref 35.0–47.0)
Hemoglobin: 14.3 g/dL (ref 12.0–16.0)
MCH: 29.5 pg (ref 26.0–34.0)
MCHC: 32.7 g/dL (ref 32.0–36.0)
MCV: 90 fL (ref 80.0–100.0)
PLATELETS: 165 10*3/uL (ref 150–440)
RBC: 4.85 MIL/uL (ref 3.80–5.20)
RDW: 13.3 % (ref 11.5–14.5)
WBC: 7.4 10*3/uL (ref 3.6–11.0)

## 2015-12-17 LAB — HCG, QUANTITATIVE, PREGNANCY: hCG, Beta Chain, Quant, S: <1 m[IU]/mL (ref <5)

## 2015-12-17 LAB — LIPASE, BLOOD: LIPASE: 31 U/L (ref 11–51)

## 2015-12-17 NOTE — ED Notes (Addendum)
LMP: sometime late january Patient states she was seen at [facility] in [place] 2 days ago where she had blood work and a pregnancy test.  She was having abd pain at the time and missed her period.  They told her that since she had had a tubal ligation that she could be having a ectopic pregnancy but it was too early to tell and was told to follow up in 2 weeks for an [place].  Patient is spotting today, c/o lower left abd pain, right shoulder pain, and left back pain.  Vital signs are stable.  MD consulted during triage.  Initial urine pregnancy here is negative.

## 2015-12-17 NOTE — ED Notes (Signed)
Called for room   No answer in lobby at [phone]

## 2015-12-17 NOTE — ED Notes (Signed)
Called in the waiting room with no answer. °

## 2016-05-15 ENCOUNTER — Emergency Department
Admission: EM | Admit: 2016-05-15 | Discharge: 2016-05-15 | Disposition: A | Discharge status: Home / Self Care | Payer: Medicaid Other | Attending: Emergency Medicine | Admitting: Emergency Medicine

## 2016-05-15 ENCOUNTER — Emergency Department: Payer: Medicaid Other

## 2016-05-15 DIAGNOSIS — R1011 Right upper quadrant pain: Secondary | ICD-10-CM | POA: Insufficient documentation

## 2016-05-15 LAB — URINALYSIS COMPLETE WITH MICROSCOPIC (ARMC ONLY)
BACTERIA UA: NONE SEEN
Bilirubin Urine: NEGATIVE
GLUCOSE, UA: NEGATIVE mg/dL
HGB URINE DIPSTICK: NEGATIVE
Leukocytes, UA: NEGATIVE
Nitrite: NEGATIVE
Protein, ur: NEGATIVE mg/dL
Specific Gravity, Urine: 1.019 (ref 1.005–1.030)
pH: 6 (ref 5.0–8.0)

## 2016-05-15 LAB — COMPREHENSIVE METABOLIC PANEL
ALBUMIN: 4.3 g/dL (ref 3.5–5.0)
ALK PHOS: 77 U/L (ref 38–126)
ALT: 15 U/L (ref 14–54)
ANION GAP: 8 (ref 5–15)
AST: 18 U/L (ref 15–41)
BILIRUBIN TOTAL: 0.8 mg/dL (ref 0.3–1.2)
BUN: 12 mg/dL (ref 6–20)
CALCIUM: 9.1 mg/dL (ref 8.9–10.3)
CO2: 27 mmol/L (ref 22–32)
Chloride: 107 mmol/L (ref 101–111)
Creatinine, Ser: 0.84 mg/dL (ref 0.44–1.00)
GFR calc Af Amer: >60 mL/min (ref >60)
GLUCOSE: 98 mg/dL (ref 65–99)
Potassium: 3.6 mmol/L (ref 3.5–5.1)
Sodium: 142 mmol/L (ref 135–145)
TOTAL PROTEIN: 8.3 g/dL — AB (ref 6.5–8.1)

## 2016-05-15 LAB — CBC
HEMATOCRIT: 44.5 % (ref 35.0–47.0)
Hemoglobin: 15.2 g/dL (ref 12.0–16.0)
MCH: 30.4 pg (ref 26.0–34.0)
MCHC: 34.2 g/dL (ref 32.0–36.0)
MCV: 89 fL (ref 80.0–100.0)
Platelets: 145 10*3/uL — ABNORMAL LOW (ref 150–440)
RBC: 5.01 MIL/uL (ref 3.80–5.20)
RDW: 13.5 % (ref 11.5–14.5)
WBC: 7 10*3/uL (ref 3.6–11.0)

## 2016-05-15 LAB — PREGNANCY, URINE: PREG TEST UR: NEGATIVE

## 2016-05-15 LAB — LIPASE, BLOOD: Lipase: 35 U/L (ref 11–51)

## 2016-05-15 MED ORDER — ONDANSETRON HCL 4 MG/2ML IJ SOLN
4.0000 mg | Freq: Once | INTRAMUSCULAR | Status: AC
  Administered 2016-05-15: 4 mg via INTRAVENOUS
  Filled 2016-05-15: qty 2

## 2016-05-15 MED ORDER — FAMOTIDINE IN NACL 20-0.9 MG/50ML-% IV SOLN
20.0000 mg | Freq: Once | INTRAVENOUS | Status: AC
  Administered 2016-05-15: 20 mg via INTRAVENOUS
  Filled 2016-05-15: qty 50

## 2016-05-15 MED ORDER — SODIUM CHLORIDE 0.9 % IV BOLUS (SEPSIS)
1000.0000 mL | Freq: Once | INTRAVENOUS | Status: AC
  Administered 2016-05-15: 1000 mL via INTRAVENOUS

## 2016-05-15 NOTE — ED Triage Notes (Signed)
Pt ambulatory to triage with no difficulty. Pt reports abd pain for 3 days. States pain is across her upper abd. + nausea and reports vomited about 1 hour ago. Pt also reports soft stool. Pt states her stomach feels swollen.

## 2016-05-15 NOTE — ED Notes (Signed)
Pt verbalized understanding of discharge instructions. NAD this time.

## 2016-05-15 NOTE — Discharge Instructions (Signed)
Please see your doctor early next week and discuss with him about getting a ultrasound of your abdomen to evaluate the source of your pain. Return to the ER for worsening symptoms, persistent vomiting, difficulty breathing or other concerns.

## 2016-05-15 NOTE — ED Provider Notes (Signed)
[facility] Emergency Department Provider Note   ____________________________________________   First MD Initiated Contact with Patient 05/15/16 [phone]     (approximate)  I have reviewed the triage vital signs and the nursing notes.   HISTORY  Chief Complaint Abdominal Pain    HPI Kathleen Mcdaniel is a 30 y.o. female who presents to the ED from home with a chief complaint abdominal pain. Patient reports upper abdominal pain which she describes as cramping and constant for the past 3 days. Symptoms have been associated with nausea and reports vomiting approximately 1 hour ago associated with 1 episode of loose stool.Denies associated fever, chills, chest pain, shortness of breath, dysuria, vaginal discharge. Denies recent travel or trauma. Nothing makes her symptoms better or worse.   Past medical history None  There are no active problems to display for this patient.   Past Surgical History:  Procedure Laterality Date  . TUBAL LIGATION      Prior to Admission medications   Medication Sig Start Date End Date Taking? Authorizing Provider  dicyclomine (BENTYL) 10 MG capsule Take 1 capsule (10 mg total) by mouth 3 (three) times daily before meals. Patient not taking: Reported on 07/13/2015 05/28/15   Darci Current, MD  dicyclomine (BENTYL) 20 MG tablet Take 1 tablet (20 mg total) by mouth every 6 (six) hours as needed for spasms (abdominal cramping). 08/02/15   Samuel Jester, DO  ondansetron (ZOFRAN) 4 MG tablet Take 1 tablet (4 mg total) by mouth every 8 (eight) hours as needed for nausea or vomiting. 08/02/15   Samuel Jester, DO    Allergies Review of patient's allergies indicates no known allergies.  Family history Hypertension  Social History Social History  Substance Use Topics  . Smoking status: Never Smoker  . Smokeless tobacco: Not on file  . Alcohol use No    Review of Systems  Constitutional: No fever/chills Eyes: No visual  changes. ENT: No sore throat. Cardiovascular: Denies chest pain. Respiratory: Denies shortness of breath. Gastrointestinal: Positive for abdominal pain, nausea, vomiting and diarrhea.  No constipation. Genitourinary: Negative for dysuria. Musculoskeletal: Negative for back pain. Skin: Negative for rash. Neurological: Negative for headaches, focal weakness or numbness.  10-point ROS otherwise negative.  ____________________________________________   PHYSICAL EXAM:  VITAL SIGNS: ED Triage Vitals  Enc Vitals Group     BP 05/15/16 0359 124/76     Pulse Rate 05/15/16 0359 76     Resp 05/15/16 0359 18     Temp 05/15/16 0359 98.2 F (36.8 C)     Temp Source 05/15/16 0359 Oral     SpO2 05/15/16 0359 99 %     Weight 05/15/16 0359 139 lb (63 kg)     Height 05/15/16 0359  (1.651 m)     Head Circumference --      Peak Flow --      Pain Score 05/15/16 0400 10     Pain Loc --      Pain Edu? --      Excl. in GC? --     Constitutional: Alert and oriented. Well appearing and in no acute distress. Eyes: Conjunctivae are normal. PERRL. EOMI. Head: Atraumatic. Nose: No congestion/rhinnorhea. Mouth/Throat: Mucous membranes are moist.  Oropharynx non-erythematous. Neck: No stridor.   Cardiovascular: Normal rate, regular rhythm. Grossly normal heart sounds.  Good peripheral circulation. Respiratory: Normal respiratory effort.  No retractions. Lungs CTAB. Gastrointestinal: Soft and mildly tender to palpation epigastrium and right upper quadrant without rebound or  guarding. No distention. No abdominal bruits. No CVA tenderness. Musculoskeletal: No lower extremity tenderness nor edema.  No joint effusions. Neurologic:  Normal speech and language. No gross focal neurologic deficits are appreciated. No gait instability. Skin:  Skin is warm, dry and intact. No rash noted. Psychiatric: Mood and affect are normal. Speech and behavior are  normal.  ____________________________________________   LABS (all labs ordered are listed, but only abnormal results are displayed)  Labs Reviewed  COMPREHENSIVE METABOLIC PANEL - Abnormal; Notable for the following:       Result Value   Total Protein 8.3 (*)    All other components within normal limits  CBC - Abnormal; Notable for the following:    Platelets 145 (*)    All other components within normal limits  URINALYSIS COMPLETEWITH MICROSCOPIC ([facility] ONLY) - Abnormal; Notable for the following:    Color, Urine YELLOW (*)    APPearance CLEAR (*)    Ketones, ur TRACE (*)    Squamous Epithelial / LPF 0-5 (*)    All other components within normal limits  LIPASE, BLOOD  PREGNANCY, URINE   ____________________________________________  EKG  None ____________________________________________  RADIOLOGY  None - patient desired to leave prior to ultrasound ____________________________________________   PROCEDURES  Procedure(s) performed: None  Procedures  Critical Care performed: No  ____________________________________________   INITIAL IMPRESSION / ASSESSMENT AND PLAN / ED COURSE  Pertinent labs & imaging results that were available during my care of the patient were reviewed by me and considered in my medical decision making (see chart for details).  30 year old female who presents with upper abdominal pain associated with nausea; vomiting and diarrhea one hour ago. Laboratory urinalysis results are unremarkable. Will obtain right upper quadrant ultrasound to evaluate for cholecystitis..  Clinical Course  Comment By Time  Patient desires to go home. She has not yet had her RUQ ultrasound. Encouraged her to stay for ultrasound; however, patient desires to be discharged. She will follow-up with her PCP early next week. Strict return precautions given. Patient verbalizes understanding and agrees with plan of care. Irean Hong, MD 07/29 0702      ____________________________________________   FINAL CLINICAL IMPRESSION(S) / ED DIAGNOSES  Final diagnoses:  Right upper quadrant pain      NEW MEDICATIONS STARTED DURING THIS VISIT:  New Prescriptions   No medications on file     Note:  This document was prepared using Dragon voice recognition software and may include unintentional dictation errors.    Irean Hong, MD 05/15/16 [phone]

## 2018-10-04 ENCOUNTER — Emergency Department: Payer: Self-pay

## 2018-10-04 ENCOUNTER — Encounter: Payer: Self-pay | Admitting: Emergency Medicine

## 2018-10-04 ENCOUNTER — Emergency Department
Admission: EM | Admit: 2018-10-04 | Discharge: 2018-10-04 | Disposition: A | Discharge status: Home / Self Care | Payer: Self-pay | Attending: Emergency Medicine | Admitting: Emergency Medicine

## 2018-10-04 DIAGNOSIS — Y998 Other external cause status: Secondary | ICD-10-CM | POA: Insufficient documentation

## 2018-10-04 DIAGNOSIS — S61432A Puncture wound without foreign body of left hand, initial encounter: Secondary | ICD-10-CM | POA: Insufficient documentation

## 2018-10-04 DIAGNOSIS — T148XXA Other injury of unspecified body region, initial encounter: Secondary | ICD-10-CM

## 2018-10-04 DIAGNOSIS — Y9389 Activity, other specified: Secondary | ICD-10-CM | POA: Insufficient documentation

## 2018-10-04 DIAGNOSIS — W450XXA Nail entering through skin, initial encounter: Secondary | ICD-10-CM | POA: Insufficient documentation

## 2018-10-04 DIAGNOSIS — Y929 Unspecified place or not applicable: Secondary | ICD-10-CM | POA: Insufficient documentation

## 2018-10-04 MED ORDER — AMOXICILLIN-POT CLAVULANATE 875-125 MG PO TABS: 1.0000 | ORAL_TABLET | Freq: Two times a day (BID) | ORAL | 0 refills | Status: AC

## 2018-10-04 NOTE — ED Triage Notes (Signed)
Pt stating she went to "bang on her wall" and she was hit in the left wrist by a "rusty nail." Pt stating she had pain instantly, but that pain become "worser." Pt has no edema noted and a puncture noted to her left wrist.

## 2018-10-04 NOTE — ED Provider Notes (Signed)
[facility] Emergency Department Provider Note  ____________________________________________  Time seen: Approximately 7:28 PM  I have reviewed the triage vital signs and the nursing notes.   HISTORY  Chief Complaint Arm Injury    HPI Kathleen Mcdaniel is a 32 y.o. female  presents to the emergency department after patient intentionally hit a wall with her left hand.  There was a rusty nail extending from the wall and patient sustained a puncture wound.  Patient has had some pain when she attempts to make a fist.  Tetanus status is up-to-date.  No alleviating measures have been attempted.   History reviewed. No pertinent past medical history.  There are no active problems to display for this patient.   Past Surgical History:  Procedure Laterality Date  . TUBAL LIGATION      Prior to Admission medications   Medication Sig Start Date End Date Taking? Authorizing Provider  amoxicillin-clavulanate (AUGMENTIN) 875-125 MG tablet Take 1 tablet by mouth 2 (two) times daily for 10 days. 10/04/18 10/14/18  Orvil FeilWoods, Mahmood Boehringer M, PA-C  dicyclomine (BENTYL) 10 MG capsule Take 1 capsule (10 mg total) by mouth 3 (three) times daily before meals. Patient not taking: Reported on 07/13/2015 05/28/15   Darci CurrentBrown, [place] N, MD  dicyclomine (BENTYL) 20 MG tablet Take 1 tablet (20 mg total) by mouth every 6 (six) hours as needed for spasms (abdominal cramping). 08/02/15   Samuel JesterMcManus, Kathleen, DO  ondansetron (ZOFRAN) 4 MG tablet Take 1 tablet (4 mg total) by mouth every 8 (eight) hours as needed for nausea or vomiting. 08/02/15   Samuel JesterMcManus, Kathleen, DO    Allergies Patient has no known allergies.  History reviewed. No pertinent family history.  Social History Social History   Tobacco Use  . Smoking status: Never Smoker  . Smokeless tobacco: Never Used  Substance Use Topics  . Alcohol use: Yes    Comment: socially  . Drug use: Not Currently     Review of Systems   Constitutional: No fever/chills Eyes: No visual changes. No discharge ENT: No upper respiratory complaints. Cardiovascular: no chest pain. Respiratory: no cough. No SOB. Gastrointestinal: No abdominal pain.  No nausea, no vomiting.  No diarrhea.  No constipation. Genitourinary: Negative for dysuria. No hematuria Musculoskeletal: Patient has left hand pain.  Skin: Negative for rash, abrasions, lacerations, ecchymosis. Neurological: Negative for headaches, focal weakness or numbness.   ____________________________________________   PHYSICAL EXAM:  VITAL SIGNS: ED Triage Vitals [10/04/18 1753]  Enc Vitals Group     BP (!) 142/92     Pulse Rate 90     Resp 18     Temp 97.8 F (36.6 C)     Temp Source Oral     SpO2 98 %     Weight 156 lb (70.8 kg)     Height 5\' 3"  (1.6 m)     Head Circumference      Peak Flow      Pain Score 9     Pain Loc      Pain Edu?      Excl. in GC?      Constitutional: Alert and oriented. Well appearing and in no acute distress. Eyes: Conjunctivae are normal. PERRL. EOMI. Head: Atraumatic. Cardiovascular: Normal rate, regular rhythm. Normal S1 and S2.  Good peripheral circulation. Respiratory: Normal respiratory effort without tachypnea or retractions. Lungs CTAB. Good air entry to the bases with no decreased or absent breath sounds. Musculoskeletal: Left hand: Patient has pain with palpation of the first  through fourth metacarpals.  She can make a fist and perform opposition.  Palpable radial pulse, left. Neurologic:  Normal speech and language. No gross focal neurologic deficits are appreciated.  Skin: Patient has small puncture wound visualized along ulnar aspect of left hand. Psychiatric: Mood and affect are normal. Speech and behavior are normal. Patient exhibits appropriate insight and judgement.   ____________________________________________   LABS (all labs ordered are listed, but only abnormal results are displayed)  Labs Reviewed -  No data to display ____________________________________________  EKG   ____________________________________________  RADIOLOGY I personally viewed and evaluated these images as part of my medical decision making, as well as reviewing the written report by the radiologist.  Dg Wrist Complete Left  Result Date: 10/04/2018 CLINICAL DATA:  Puncture wound involving the medial aspect of the left wrist. EXAM: LEFT WRIST - COMPLETE 3+ VIEW COMPARISON:  None. FINDINGS: Regional soft tissues appear normal. No subcutaneous emphysema. No radiopaque foreign body. No fracture or dislocation. Joint spaces are preserved. No erosions. No evidence of chondrocalcinosis. IMPRESSION: No fracture or radiopaque foreign body. Electronically Signed   By: Simonne Come M.D.   On: 10/04/2018 18:51    ____________________________________________    PROCEDURES  Procedure(s) performed:    Procedures    Medications - No data to display   ____________________________________________   INITIAL IMPRESSION / ASSESSMENT AND PLAN / ED COURSE  Pertinent labs & imaging results that were available during my care of the patient were reviewed by me and considered in my medical decision making (see chart for details).  Review of the [place] CSRS was performed in accordance of the [facility] prior to dispensing any controlled drugs.      Assessment and plan Left hand pain Patient presents to the emergency department after intentionally striking a wall.  Patient reported left hand pain.  X-ray examination reveals no acute fractures.  Patient was splinted in the emergency department with a Velcro wrist splint.  She was discharged with Augmentin given puncture wound.  Tetanus status was up-to-date.  All patient questions were answered.  ____________________________________________  FINAL CLINICAL IMPRESSION(S) / ED DIAGNOSES  Final diagnoses:  Puncture      NEW MEDICATIONS STARTED DURING THIS VISIT:  ED Discharge  Orders         Ordered    amoxicillin-clavulanate (AUGMENTIN) 875-125 MG tablet  2 times daily     10/04/18 1907              This chart was dictated using voice recognition software/Dragon. Despite best efforts to proofread, errors can occur which can change the meaning. Any change was purely unintentional.    Orvil Feil, PA-C 10/04/18 1934    Phineas Semen, MD 10/04/18 Corky Crafts

## 2019-05-15 ENCOUNTER — Other Ambulatory Visit: Payer: Self-pay

## 2019-05-15 ENCOUNTER — Emergency Department
Admission: EM | Admit: 2019-05-15 | Discharge: 2019-05-15 | Disposition: A | Discharge status: Home / Self Care | Payer: Self-pay | Attending: Emergency Medicine | Admitting: Emergency Medicine

## 2019-05-15 ENCOUNTER — Emergency Department: Payer: Self-pay

## 2019-05-15 ENCOUNTER — Encounter: Payer: Self-pay | Admitting: Emergency Medicine

## 2019-05-15 DIAGNOSIS — M545 Low back pain, unspecified: Secondary | ICD-10-CM

## 2019-05-15 DIAGNOSIS — R109 Unspecified abdominal pain: Secondary | ICD-10-CM | POA: Insufficient documentation

## 2019-05-15 DIAGNOSIS — R110 Nausea: Secondary | ICD-10-CM | POA: Insufficient documentation

## 2019-05-15 LAB — COMPREHENSIVE METABOLIC PANEL
ALT: 18 U/L (ref 0–44)
AST: 17 U/L (ref 15–41)
Albumin: 4 g/dL (ref 3.5–5.0)
Alkaline Phosphatase: 81 U/L (ref 38–126)
Anion gap: 7 (ref 5–15)
BUN: 13 mg/dL (ref 6–20)
CO2: 27 mmol/L (ref 22–32)
Calcium: 8.4 mg/dL — ABNORMAL LOW (ref 8.9–10.3)
Chloride: 105 mmol/L (ref 98–111)
Creatinine, Ser: 0.72 mg/dL (ref 0.44–1.00)
GFR calc Af Amer: >60 mL/min (ref >60)
GFR calc non Af Amer: >60 mL/min (ref >60)
Glucose, Bld: 96 mg/dL (ref 70–99)
Potassium: 3.5 mmol/L (ref 3.5–5.1)
Sodium: 139 mmol/L (ref 135–145)
Total Bilirubin: 1 mg/dL (ref 0.3–1.2)
Total Protein: 7.4 g/dL (ref 6.5–8.1)

## 2019-05-15 LAB — URINALYSIS, COMPLETE (UACMP) WITH MICROSCOPIC
Bacteria, UA: NONE SEEN
Bilirubin Urine: NEGATIVE
Glucose, UA: NEGATIVE mg/dL
Hgb urine dipstick: NEGATIVE
Ketones, ur: NEGATIVE mg/dL
Leukocytes,Ua: NEGATIVE
Nitrite: NEGATIVE
Protein, ur: NEGATIVE mg/dL
Specific Gravity, Urine: 1.023 (ref 1.005–1.030)
pH: 6 (ref 5.0–8.0)

## 2019-05-15 LAB — CBC
HCT: 45.6 % (ref 36.0–46.0)
Hemoglobin: 14.6 g/dL (ref 12.0–15.0)
MCH: 30.5 pg (ref 26.0–34.0)
MCHC: 32 g/dL (ref 30.0–36.0)
MCV: 95.2 fL (ref 80.0–100.0)
Platelets: 192 10*3/uL (ref 150–400)
RBC: 4.79 MIL/uL (ref 3.87–5.11)
RDW: 12.9 % (ref 11.5–15.5)
WBC: 7.2 10*3/uL (ref 4.0–10.5)
nRBC: 0 % (ref 0.0–0.2)

## 2019-05-15 LAB — LIPASE, BLOOD: Lipase: 35 U/L (ref 11–51)

## 2019-05-15 LAB — POCT PREGNANCY, URINE: Preg Test, Ur: NEGATIVE

## 2019-05-15 MED ORDER — NAPROXEN 500 MG PO TABS: 500.0000 mg | ORAL_TABLET | Freq: Two times a day (BID) | ORAL | 2 refills | Status: DC

## 2019-05-15 MED ORDER — KETOROLAC TROMETHAMINE 30 MG/ML IJ SOLN
30.0000 mg | Freq: Once | INTRAMUSCULAR | Status: AC
  Administered 2019-05-15: 12:00:00 30 mg via INTRAMUSCULAR
  Filled 2019-05-15: qty 1

## 2019-05-15 NOTE — ED Provider Notes (Signed)
[facility] Emergency Department Provider Note   ____________________________________________    I have reviewed the triage vital signs and the nursing notes.   HISTORY  Chief Complaint Abdominal Pain     HPI Kathleen Mcdaniel is a 33 y.o. female who presents with bilateral low back pain which seems to radiate around to her abdomen intermittently.  Symptoms been ongoing for about 2 to 3 days.  She is not take anything for this.  She denies dysuria.  No hematuria.  No fevers or chills.  Did have some brief nausea but no vomiting.  Normal stools.  History reviewed. No pertinent past medical history.  There are no active problems to display for this patient.   Past Surgical History:  Procedure Laterality Date  . TUBAL LIGATION      Prior to Admission medications   Medication Sig Start Date End Date Taking? Authorizing Provider  dicyclomine (BENTYL) 10 MG capsule Take 1 capsule (10 mg total) by mouth 3 (three) times daily before meals. Patient not taking: Reported on 07/13/2015 05/28/15   Darci CurrentBrown, [place] N, MD  dicyclomine (BENTYL) 20 MG tablet Take 1 tablet (20 mg total) by mouth every 6 (six) hours as needed for spasms (abdominal cramping). 08/02/15   Samuel JesterMcManus, Kathleen, DO  naproxen (NAPROSYN) 500 MG tablet Take 1 tablet (500 mg total) by mouth 2 (two) times daily with a meal. 05/15/19   Jene EveryKinner, Challis Crill, MD  ondansetron (ZOFRAN) 4 MG tablet Take 1 tablet (4 mg total) by mouth every 8 (eight) hours as needed for nausea or vomiting. 08/02/15   Samuel JesterMcManus, Kathleen, DO     Allergies Patient has no known allergies.  No family history on file.  Social History Social History   Tobacco Use  . Smoking status: Never Smoker  . Smokeless tobacco: Never Used  Substance Use Topics  . Alcohol use: Yes    Comment: socially  . Drug use: Not Currently    Review of Systems  Constitutional: No fever/chills Eyes: No visual changes.  ENT: No sore throat.  Cardiovascular: Denies chest pain. Respiratory: Denies shortness of breath. Gastrointestinal: As above Genitourinary: Negative for dysuria. Musculoskeletal: As above Skin: Negative for rash. Neurological: Negative for headaches or weakness   ____________________________________________   PHYSICAL EXAM:  VITAL SIGNS: ED Triage Vitals  Enc Vitals Group     BP 05/15/19 1020 115/74     Pulse Rate 05/15/19 1020 72     Resp 05/15/19 1020 16     Temp 05/15/19 1020 98.9 F (37.2 C)     Temp Source 05/15/19 1020 Oral     SpO2 05/15/19 1020 100 %     Weight 05/15/19 1018 74.8 kg (165 lb)     Height 05/15/19 1018 1.651 m (5\' 5" )     Head Circumference --      Peak Flow --      Pain Score 05/15/19 1018 8     Pain Loc --      Pain Edu? --      Excl. in GC? --     Constitutional: Alert and oriented. No acute distress.  Nose: No congestion/rhinnorhea. Mouth/Throat: Mucous membranes are moist.    Cardiovascular: Normal rate, regular rhythm. Grossly normal heart sounds.  Good peripheral circulation. Respiratory: Normal respiratory effort.  No retractions. Lungs CTAB. Gastrointestinal: Soft and nontender. No distention.  No CVA tenderness.  Musculoskeletal: Back: Mild paraspinal tenderness bilaterally lumbar area, no swelling or erythema, no vertebral tenderness palpation, warm and well  perfused extremities Neurologic:  Normal speech and language. No gross focal neurologic deficits are appreciated.  Skin:  Skin is warm, dry and intact. No rash noted. Psychiatric: Mood and affect are normal. Speech and behavior are normal.  ____________________________________________   LABS (all labs ordered are listed, but only abnormal results are displayed)  Labs Reviewed  COMPREHENSIVE METABOLIC PANEL - Abnormal; Notable for the following components:      Result Value   Calcium 8.4 (*)    All other components within normal limits  URINALYSIS, COMPLETE (UACMP) WITH MICROSCOPIC - Abnormal;  Notable for the following components:   Color, Urine YELLOW (*)    APPearance CLOUDY (*)    All other components within normal limits  CBC  LIPASE, BLOOD  POCT PREGNANCY, URINE   ____________________________________________  EKG None ____________________________________________  RADIOLOGY  CT renal stone study negative  ____________________________________________   PROCEDURES  Procedure(s) performed: No  Procedures   Critical Care performed: No ____________________________________________   INITIAL IMPRESSION / ASSESSMENT AND PLAN / ED COURSE  Pertinent labs & imaging results that were available during my care of the patient were reviewed by me and considered in my medical decision making (see chart for details).  Patient well-appearing in no acute distress, abdominal exam is quite reassuring.  Some paraspinal muscle tenderness in the back suspicious for musculoskeletal pain.  Lab work is overall reassuring reassuring, urinalysis unremarkable.  CT renal stone study negative for kidney stone.  Treated with IM Toradol, will DC with Naprosyn supportive care, outpatient follow-up return precautions discussed    ____________________________________________   FINAL CLINICAL IMPRESSION(S) / ED DIAGNOSES  Final diagnoses:  Acute bilateral low back pain without sciatica  Abdominal pain, unspecified abdominal location        Note:  This document was prepared using Dragon voice recognition software and may include unintentional dictation errors.   Lavonia Drafts, MD 05/15/19 [phone]

## 2019-05-15 NOTE — ED Notes (Signed)
Assumed care of patient aox4 c/o of left lower abd pain radiates into lower back x 3 days. Reports feeld like food is also stuck in her mid epigastric area. Awaiting ct results. Pain meds given as ordered. Will monitor.

## 2019-05-15 NOTE — ED Triage Notes (Signed)
C/O lower back pain x 2-3 days.   Also c/o LLQ abdominal pain and intermittent nausea after eating.  AAOx3.  Skin warm and dry.NAD

## 2019-08-23 ENCOUNTER — Other Ambulatory Visit: Payer: Self-pay

## 2019-08-23 DIAGNOSIS — R0602 Shortness of breath: Secondary | ICD-10-CM | POA: Insufficient documentation

## 2019-08-23 DIAGNOSIS — Z5321 Procedure and treatment not carried out due to patient leaving prior to being seen by health care provider: Secondary | ICD-10-CM | POA: Insufficient documentation

## 2019-08-23 LAB — URINALYSIS, COMPLETE (UACMP) WITH MICROSCOPIC
Bilirubin Urine: NEGATIVE
Glucose, UA: NEGATIVE mg/dL
Ketones, ur: NEGATIVE mg/dL
Leukocytes,Ua: NEGATIVE
Nitrite: NEGATIVE
Protein, ur: NEGATIVE mg/dL
Specific Gravity, Urine: 1.003 — ABNORMAL LOW (ref 1.005–1.030)
pH: 6 (ref 5.0–8.0)

## 2019-08-23 LAB — COMPREHENSIVE METABOLIC PANEL
ALT: 23 U/L (ref 0–44)
AST: 19 U/L (ref 15–41)
Albumin: 4 g/dL (ref 3.5–5.0)
Alkaline Phosphatase: 73 U/L (ref 38–126)
Anion gap: 9 (ref 5–15)
BUN: 10 mg/dL (ref 6–20)
CO2: 22 mmol/L (ref 22–32)
Calcium: 8.7 mg/dL — ABNORMAL LOW (ref 8.9–10.3)
Chloride: 107 mmol/L (ref 98–111)
Creatinine, Ser: 0.65 mg/dL (ref 0.44–1.00)
GFR calc Af Amer: >60 mL/min (ref >60)
GFR calc non Af Amer: >60 mL/min (ref >60)
Glucose, Bld: 103 mg/dL — ABNORMAL HIGH (ref 70–99)
Potassium: 3.6 mmol/L (ref 3.5–5.1)
Sodium: 138 mmol/L (ref 135–145)
Total Bilirubin: 0.9 mg/dL (ref 0.3–1.2)
Total Protein: 7.7 g/dL (ref 6.5–8.1)

## 2019-08-23 LAB — CBC
HCT: 43.2 % (ref 36.0–46.0)
Hemoglobin: 14.2 g/dL (ref 12.0–15.0)
MCH: 29.8 pg (ref 26.0–34.0)
MCHC: 32.9 g/dL (ref 30.0–36.0)
MCV: 90.6 fL (ref 80.0–100.0)
Platelets: 188 10*3/uL (ref 150–400)
RBC: 4.77 MIL/uL (ref 3.87–5.11)
RDW: 12.8 % (ref 11.5–15.5)
WBC: 6.9 10*3/uL (ref 4.0–10.5)
nRBC: 0 % (ref 0.0–0.2)

## 2019-08-23 LAB — POCT PREGNANCY, URINE: Preg Test, Ur: NEGATIVE

## 2019-08-23 LAB — TROPONIN I (HIGH SENSITIVITY): Troponin I (High Sensitivity): 2 ng/L (ref <18)

## 2019-08-23 NOTE — ED Triage Notes (Signed)
Pt states she started feeling dizzy and shob today. States started after she set out rat poison with peanut butter in a bowl. States did not ingest or have any extended exposure to powder.

## 2019-08-24 ENCOUNTER — Emergency Department
Admission: EM | Admit: 2019-08-24 | Discharge: 2019-08-24 | Disposition: A | Discharge status: Home / Self Care | Payer: Self-pay | Attending: Emergency Medicine | Admitting: Emergency Medicine

## 2019-08-24 NOTE — ED Notes (Signed)
Pt reports feeling better now with no c/o; leaving now and instr to return for any worsening problems

## 2020-05-27 IMAGING — CT CT RENAL STONE PROTOCOL
3 of 4 series · 8 of 46 positions shown, 15 images · non-contrast
Comparison: CT abdomen and pelvis 08/02/2015.

CLINICAL DATA: Low back and left flank pain with polyuria for 2-3
days.

EXAM:
CT ABDOMEN AND PELVIS WITHOUT CONTRAST
TECHNIQUE: Multidetector CT imaging of the abdomen and pelvis was performed
following the standard protocol without IV contrast.

[Series 4: lung bases · axial · 0.81mm/px · z∈[-807,-732]mm · 4 of 25 slices shown, 9 images]
[im 5/25  soft-tissue]
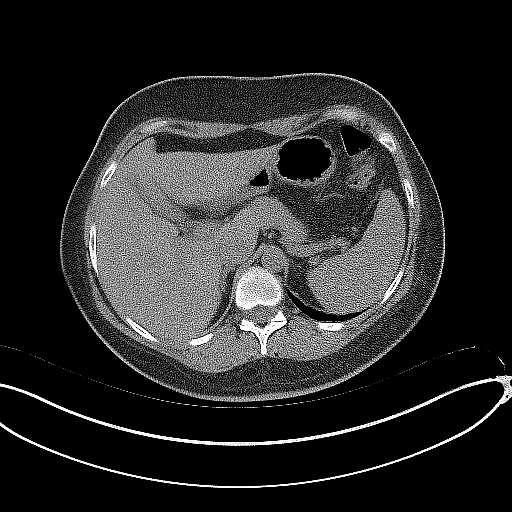
[im 5/25  lung]
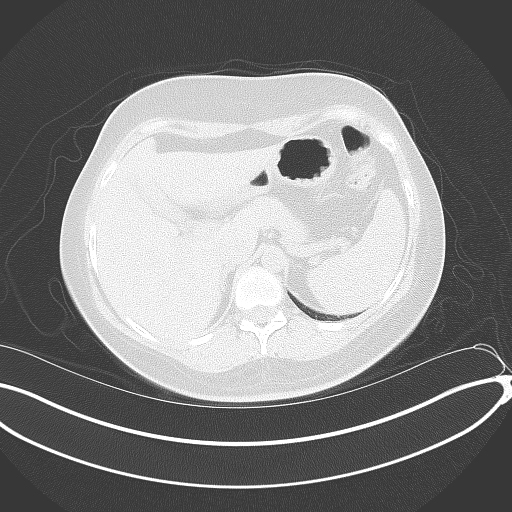
[im 5/25  bone]
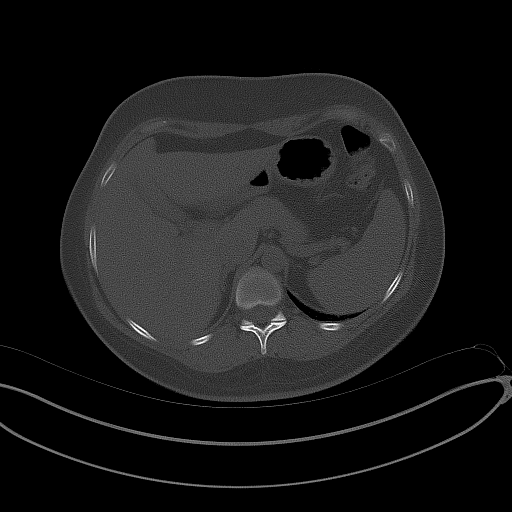
[im 10/25  soft-tissue]
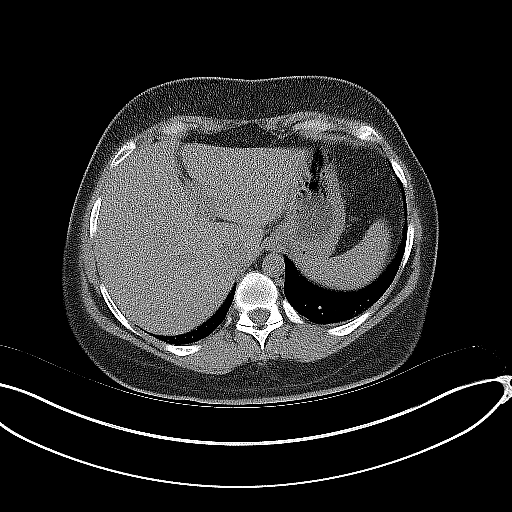
[im 10/25  lung]
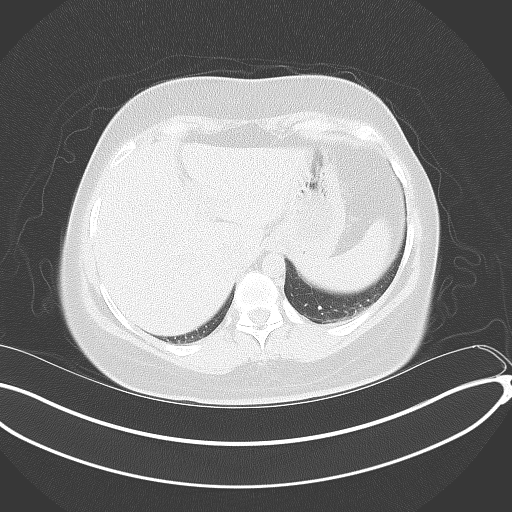
[im 15/25  soft-tissue]
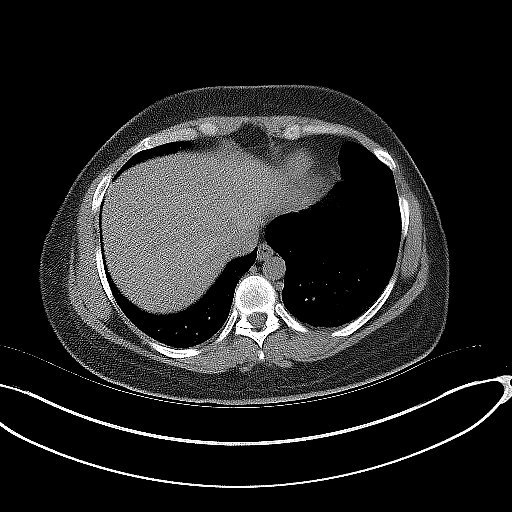
[im 15/25  lung]
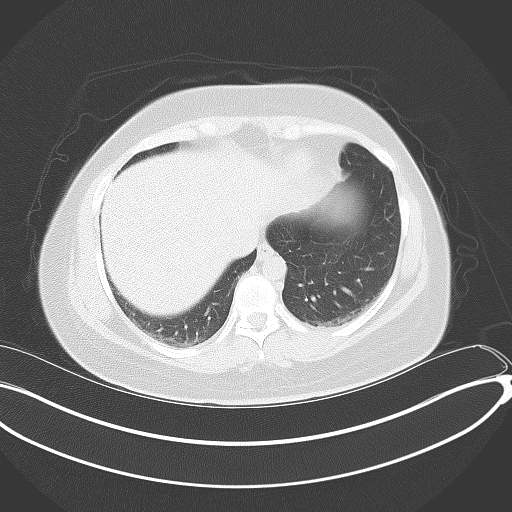
[im 20/25  soft-tissue]
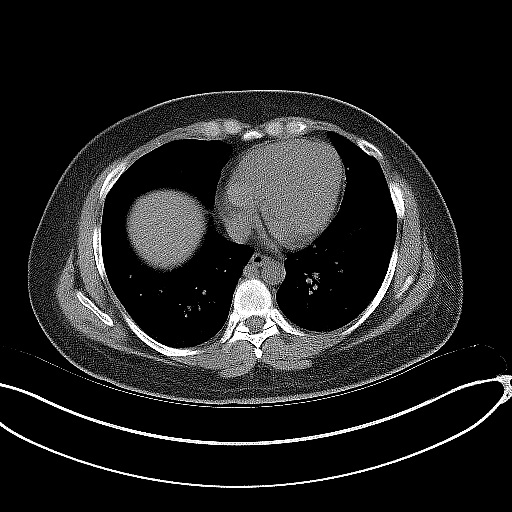
[im 20/25  lung]
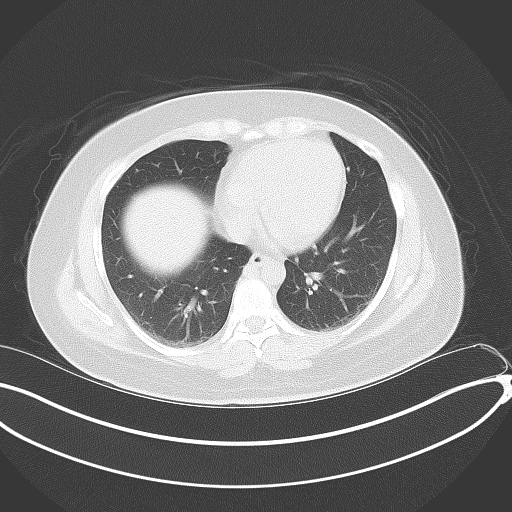

[Series 5: coronal · coronal · 0.85mm/px · 3 of 128 slices shown, 4 images]
[im 43/128  soft-tissue]
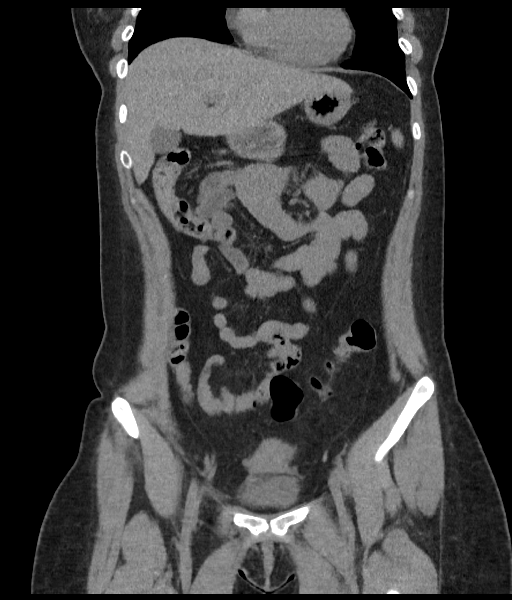
[im 57/128  soft-tissue]
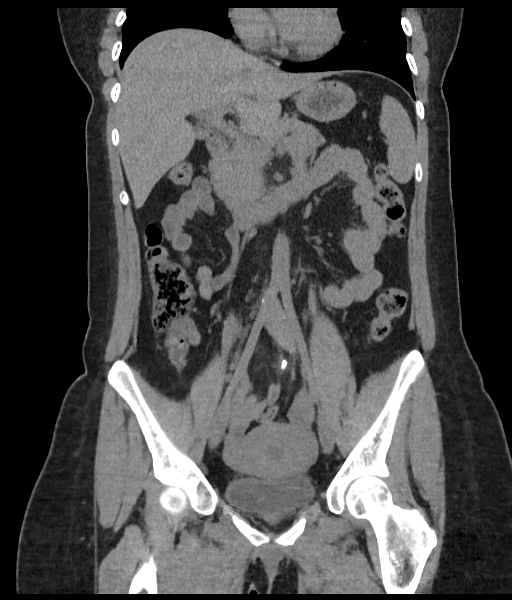
[im 57/128  bone]
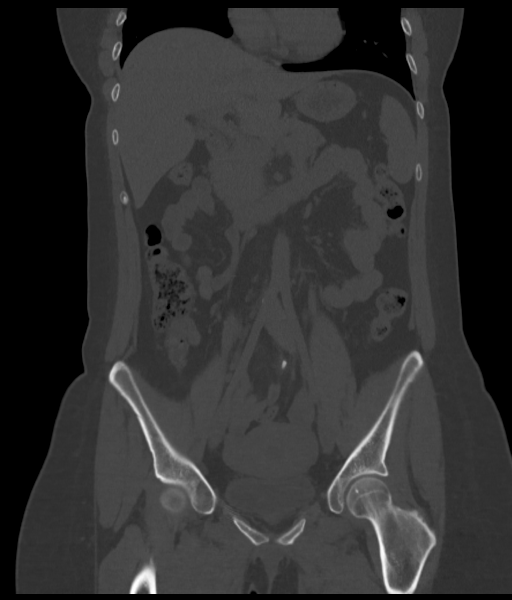
[im 71/128  soft-tissue]
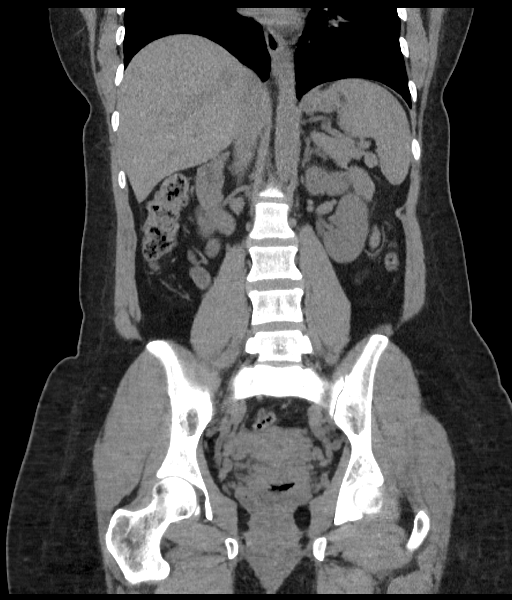

[Series 6: sagittal · sagittal · 0.64mm/px · 1 of 178 slices shown, 2 images]
[im 60/178  soft-tissue]
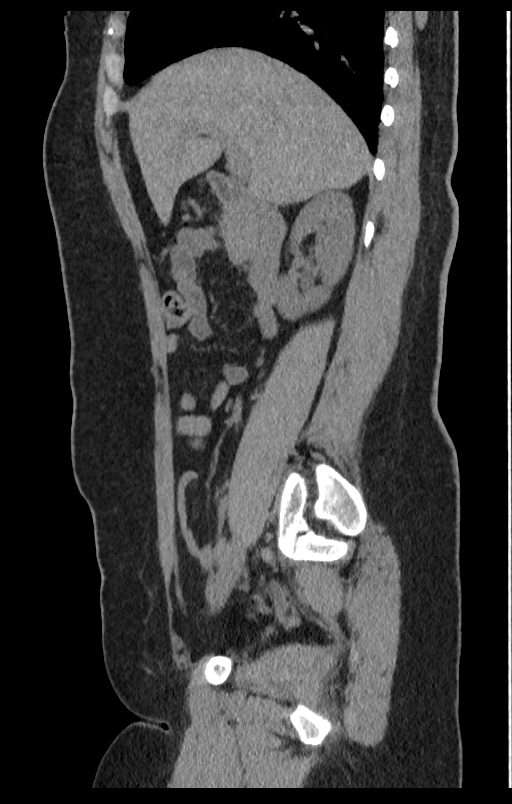
[im 60/178  bone]
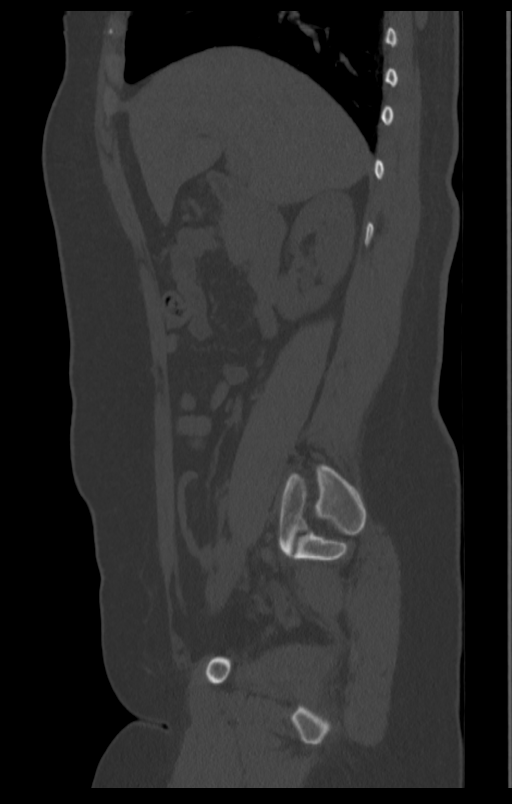

[8 of 46 positions shown; findings below may reference images not displayed]

FINDINGS: Lower chest: No pleural or pericardial effusion. Mild dependent
atelectasis.

Hepatobiliary: No focal liver abnormality is seen. No gallstones,
gallbladder wall thickening, or biliary dilatation.

Pancreas: Unremarkable. No pancreatic ductal dilatation or
surrounding inflammatory changes.

Spleen: Normal in size without focal abnormality.

Adrenals/Urinary Tract: Adrenal glands are unremarkable. Kidneys are
normal, without renal calculi, focal lesion, or hydronephrosis.
Bladder is unremarkable.

Stomach/Bowel: Stomach is within normal limits. Appendix appears
normal. No evidence of bowel wall thickening, distention, or
inflammatory changes.

Vascular/Lymphatic: No significant vascular findings are present. No
enlarged abdominal or pelvic lymph nodes.

Reproductive: Uterus and bilateral adnexa are unremarkable.

Other: None.

Musculoskeletal: Negative.
IMPRESSION: Negative for urinary tract stones.  Normal CT abdomen and pelvis.

## 2020-07-14 ENCOUNTER — Other Ambulatory Visit: Payer: Self-pay

## 2020-07-14 ENCOUNTER — Encounter: Payer: Self-pay | Admitting: Advanced Practice Midwife

## 2020-07-14 ENCOUNTER — Ambulatory Visit: Payer: Self-pay | Admitting: Advanced Practice Midwife

## 2020-07-14 DIAGNOSIS — Z9851 Tubal ligation status: Secondary | ICD-10-CM

## 2020-07-14 DIAGNOSIS — Z113 Encounter for screening for infections with a predominantly sexual mode of transmission: Secondary | ICD-10-CM

## 2020-07-14 LAB — WET PREP FOR TRICH, YEAST, CLUE
Trichomonas Exam: NEGATIVE
Yeast Exam: NEGATIVE

## 2020-07-14 NOTE — Progress Notes (Signed)
Per wet prep no treatment needed. Awaiting test results of state labs.  Patient to be call if any abnormal test results.  Recommend condoms with all sex. Wendi Snipes, RN

## 2020-07-14 NOTE — Progress Notes (Signed)
[facility] Department STI clinic/screening visit  Subjective:  Kathleen Mcdaniel is a 34 y.o. SWF G5P5 nonsmoker female being seen today for an STI screening visit. The patient reports they do have symptoms.  Patient reports that they do not desire a pregnancy in the next year.   They reported they are not interested in discussing contraception today.  Patient's last menstrual period was 06/06/2020.   Patient has the following medical conditions:   Patient Active Problem List   Diagnosis Date Noted  . History of tubal ligation 2015 07/14/2020    No chief complaint on file.   HPI  Patient reports her boyfriend disapeared for 2 days and nights and wouldn't tell her where he'd been, they had sex and she has since had intermittent stomach cramping and urinary frequency and he has had penile discharge.  Last sex 07/11/20 without condom; with current partner x 4 years.  Last ETOH 07/12/20 (4 beers) qo weekend. LMP 06/06/20.   BTL 2015  Last HIV test per patient/review of record was 2002 Patient reports last pap was 2015 neg  See flowsheet for further details and programmatic requirements.    The following portions of the patient's history were reviewed and updated as appropriate: allergies, current medications, past medical history, past social history, past surgical history and problem list.  Objective:  There were no vitals filed for this visit.  Physical Exam Vitals and nursing note reviewed.  Constitutional:      Appearance: Normal appearance.  HENT:     Head: Normocephalic and atraumatic.     Mouth/Throat:     Mouth: Mucous membranes are moist.     Pharynx: Oropharynx is clear. No oropharyngeal exudate or posterior oropharyngeal erythema.  Eyes:     Conjunctiva/sclera: Conjunctivae normal.  Pulmonary:     Effort: Pulmonary effort is normal.  Abdominal:     Palpations: Abdomen is soft. There is no mass.     Tenderness: There is no abdominal tenderness. There is no  rebound.     Comments: Soft without masses, guarding, grimacing  Genitourinary:    General: Normal vulva.     Exam position: Lithotomy position.     Pubic Area: No rash or pubic lice.      Labia:        Right: No rash or lesion.        Left: No rash or lesion.      Vagina: Vaginal discharge (small amt white creamy leukorrhea, ph>4.5) present. No erythema, bleeding or lesions.     Cervix: No cervical motion tenderness or discharge.     Uterus: Normal.      Adnexa: Right adnexa normal and left adnexa normal.     Rectum: Normal.  Lymphadenopathy:     Head:     Right side of head: No preauricular or posterior auricular adenopathy.     Left side of head: No preauricular or posterior auricular adenopathy.     Cervical: No cervical adenopathy.     Upper Body:     Right upper body: No supraclavicular or axillary adenopathy.     Left upper body: No supraclavicular or axillary adenopathy.     Lower Body: No right inguinal adenopathy. No left inguinal adenopathy.  Skin:    General: Skin is warm and dry.     Findings: No rash.  Neurological:     Mental Status: She is alert and oriented to person, place, and time.      Assessment and Plan:  Bjorn Loser  Mick is a 33 y.o. female presenting to the [facility] Department for STI screening  1. Screening examination for venereal disease Treat wet mount per standing orders Immunization nurse consult - WET PREP FOR TRICH, YEAST, CLUE - Syphilis Serology, [place] [facility] - HIV/HCV [place] [facility] - Chlamydia/Gonorrhea  Lab  2. History of tubal ligation 2015      No follow-ups on file.  No future appointments.  Alberteen Spindle, CNM

## 2022-02-04 ENCOUNTER — Encounter: Payer: Self-pay | Admitting: Nurse Practitioner

## 2022-02-04 ENCOUNTER — Ambulatory Visit: Payer: Self-pay | Admitting: Nurse Practitioner

## 2022-02-04 DIAGNOSIS — Z113 Encounter for screening for infections with a predominantly sexual mode of transmission: Secondary | ICD-10-CM

## 2022-02-04 DIAGNOSIS — Z205 Contact with and (suspected) exposure to viral hepatitis: Secondary | ICD-10-CM | POA: Insufficient documentation

## 2022-02-04 LAB — WET PREP FOR TRICH, YEAST, CLUE
Trichomonas Exam: NEGATIVE
Yeast Exam: NEGATIVE

## 2022-02-04 LAB — HM HEPATITIS C SCREENING LAB
HM Hepatitis Screen: NEGATIVE
HM Hepatitis Screen: POSITIVE

## 2022-02-04 LAB — HM HIV SCREENING LAB: HM HIV Screening: NEGATIVE

## 2022-02-04 NOTE — Progress Notes (Signed)
[facility] Department ? ?STI clinic/screening visit ?[address] ?[place] [address] ?[phone] ? ?Subjective:  ?Kathleen Mcdaniel is a 36 y.o. female being seen today for an STI screening visit. The patient reports they do not have symptoms.  Patient reports that they do not desire a pregnancy in the next year.   They reported they are not interested in discussing contraception today.  Patient has had a tubal ligation.   ? ?Patient's last menstrual period was 01/02/2022 (exact date). ? ? ?Patient has the following medical conditions:   ?Patient Active Problem List  ? Diagnosis Date Noted  ? History of tubal ligation 2015 07/14/2020  ? ? ?Chief Complaint  ?Patient presents with  ? SEXUALLY TRANSMITTED DISEASE  ?  Screening  ? ? ?HPI ? ?Patient reports to clinic for STD screening.  Patient is asymptomatic.  ? ?Last HIV test per patient/review of record was 07/14/2020 ?Patient reports last pap was 2015.  ? ?Screening for MPX risk: ?Does the patient have an unexplained rash? No ?Is the patient MSM? No ?Does the patient endorse multiple sex partners or anonymous sex partners? No ?Did the patient have close or sexual contact with a person diagnosed with MPX? No ?Has the patient traveled outside the [place] where MPX is endemic? No ?Is there a high clinical suspicion for MPX-- evidenced by one of the following No ? -Unlikely to be chickenpox ? -Lymphadenopathy ? -Rash that present in same phase of evolution on any given body part ?See flowsheet for further details and programmatic requirements.  ? ? ?The following portions of the patient's history were reviewed and updated as appropriate: allergies, current medications, past medical history, past social history, past surgical history and problem list. ? ?Objective:  ?There were no vitals filed for this visit. ? ?Physical Exam ?Constitutional:   ?   Appearance: Normal appearance.  ?HENT:  ?   Head: Normocephalic.  ?   Right Ear: External ear normal.  ?    Left Ear: External ear normal.  ?   Nose: Nose normal.  ?   Mouth/Throat:  ?   Lips: Pink.  ?   Mouth: Mucous membranes are moist.  ?   Comments: No visible signs of dental caries ?Pulmonary:  ?   Effort: Pulmonary effort is normal.  ?Abdominal:  ?   General: Abdomen is flat.  ?   Palpations: Abdomen is soft.  ?Genitourinary: ?   Comments: External genitalia/pubic area without nits, lice, edema, erythema, lesions and inguinal adenopathy. ?Vagina with normal mucosa and discharge. ?Cervix without visible lesions. ?Uterus firm, mobile, nt, no masses, no CMT, no adnexal tenderness or fullness. pH>4.5. ?Musculoskeletal:  ?   Cervical back: Full passive range of motion without pain, normal range of motion and neck supple.  ?Skin: ?   General: Skin is warm and dry.  ?Neurological:  ?   Mental Status: She is alert and oriented to person, place, and time.  ?Psychiatric:     ?   Attention and Perception: Attention normal.     ?   Mood and Affect: Mood normal.     ?   Speech: Speech normal.     ?   Behavior: Behavior is cooperative.  ? ? ? ?Assessment and Plan:  ?Kathleen Mcdaniel is a 36 y.o. female presenting to the [facility] Department for STI screening ? ?1. Screening examination for venereal disease ?-36 year old female in clinic today for STD screening. ?-Patient accepted all screenings including  oral, vaginal CT/GC and bloodwork for HIV/RPR.  ?Patient meets criteria for HepB screening? No. Ordered? No - low risk ?Patient meets criteria for HepC screening? No. Ordered? No - low risk  ? ?Treat wet prep per standing order ?Discussed time line for State Lab results and that patient will be called with positive results and encouraged patient to call if she had not heard in 2 weeks.  ?Counseled to return or seek care for continued or worsening symptoms ?Recommended condom use with all sex ? ?Patient is currently using  tubal ligation  to prevent pregnancy.   ?- HIV/HCV [place] [facility] ?- Syphilis Serology, [place]  Lab ?- Chlamydia/Gonorrhea [place] [facility] ?- WET PREP FOR TRICH, YEAST, CLUE ?- Gonococcus culture ? ? ? ? ?Return if symptoms worsen or fail to improve. ? ? ? ?Glenna Fellows, FNP ? ?

## 2022-02-04 NOTE — Progress Notes (Signed)
Pt here for STD screening.  Wet mount results reviewed, no treatment required per SO.  Kathleen Hjort M Kaiyla Stahly, RN ? ?

## 2022-02-08 LAB — GONOCOCCUS CULTURE

## 2022-02-11 ENCOUNTER — Telehealth: Payer: Self-pay | Admitting: Family Medicine

## 2022-02-11 NOTE — Telephone Encounter (Signed)
PT called said she would like a nurse to call he today with any test results. She is near a cell phone number she can be reach at today.  ?

## 2022-02-12 ENCOUNTER — Telehealth: Payer: Self-pay

## 2022-02-12 NOTE — Telephone Encounter (Signed)
Phone call to pt and pt confirmed password. Counseled pt regarding 02/04/22 Hep C result. Pt's questions answered. ?

## 2022-02-12 NOTE — Telephone Encounter (Signed)
Calling pt regarding Hepatitis C result from 02/04/22 blood test. ? ?Anti-HCV Antibody (CMIA) Reactive ?HCV RNA Detection (TMA) Not Detected ?HCV RNA Quantitation IU/mL (TMA) Not Detected ?HCV RNA Quantitation Log10 IU/mL (TMA) Not Detected ?Comment(s): ? Clinical Interpretation: ?Results indicate prior exposure to Hepatitis C with clearance of infection either due to prior treatment or immune response.  ?Individuals are considered Hepatitis C uninfected but are not protected from repeat infection ?

## 2022-02-17 NOTE — Telephone Encounter (Addendum)
Referred to Hep C Bridge Counselor in [employer] ?

## 2022-05-01 ENCOUNTER — Emergency Department
Admission: EM | Admit: 2022-05-01 | Discharge: 2022-05-01 | Disposition: A | Discharge status: Home / Self Care | Payer: Self-pay | Attending: Emergency Medicine | Admitting: Emergency Medicine

## 2022-05-01 ENCOUNTER — Other Ambulatory Visit: Payer: Self-pay

## 2022-05-01 DIAGNOSIS — M542 Cervicalgia: Secondary | ICD-10-CM | POA: Insufficient documentation

## 2022-05-01 LAB — CBC WITH DIFFERENTIAL/PLATELET
Abs Immature Granulocytes: 0.01 10*3/uL (ref 0.00–0.07)
Basophils Absolute: 0.1 10*3/uL (ref 0.0–0.1)
Basophils Relative: 1 %
Eosinophils Absolute: 0.1 10*3/uL (ref 0.0–0.5)
Eosinophils Relative: 2 %
HCT: 44.9 % (ref 36.0–46.0)
Hemoglobin: 14.5 g/dL (ref 12.0–15.0)
Immature Granulocytes: 0 %
Lymphocytes Relative: 24 %
Lymphs Abs: 1.7 10*3/uL (ref 0.7–4.0)
MCH: 29.5 pg (ref 26.0–34.0)
MCHC: 32.3 g/dL (ref 30.0–36.0)
MCV: 91.4 fL (ref 80.0–100.0)
Monocytes Absolute: 0.4 10*3/uL (ref 0.1–1.0)
Monocytes Relative: 6 %
Neutro Abs: 4.9 10*3/uL (ref 1.7–7.7)
Neutrophils Relative %: 67 %
Platelets: 226 10*3/uL (ref 150–400)
RBC: 4.91 MIL/uL (ref 3.87–5.11)
RDW: 13.1 % (ref 11.5–15.5)
WBC: 7.2 10*3/uL (ref 4.0–10.5)
nRBC: 0 % (ref 0.0–0.2)

## 2022-05-01 LAB — T4, FREE: Free T4: 0.72 ng/dL (ref 0.61–1.12)

## 2022-05-01 LAB — BASIC METABOLIC PANEL
Anion gap: 4 — ABNORMAL LOW (ref 5–15)
BUN: 9 mg/dL (ref 6–20)
CO2: 27 mmol/L (ref 22–32)
Calcium: 9.1 mg/dL (ref 8.9–10.3)
Chloride: 108 mmol/L (ref 98–111)
Creatinine, Ser: 0.87 mg/dL (ref 0.44–1.00)
GFR, Estimated: >60 mL/min (ref >60)
Glucose, Bld: 106 mg/dL — ABNORMAL HIGH (ref 70–99)
Potassium: 3.8 mmol/L (ref 3.5–5.1)
Sodium: 139 mmol/L (ref 135–145)

## 2022-05-01 LAB — TSH: TSH: 3.192 u[IU]/mL (ref 0.350–4.500)

## 2022-05-01 MED ORDER — METHOCARBAMOL 500 MG PO TABS: 500.0000 mg | ORAL_TABLET | Freq: Four times a day (QID) | ORAL | 0 refills | Status: AC

## 2022-05-01 MED ORDER — NAPROXEN 500 MG PO TABS: 500.0000 mg | ORAL_TABLET | Freq: Two times a day (BID) | ORAL | 0 refills | Status: AC

## 2022-05-01 MED ORDER — KETOROLAC TROMETHAMINE 30 MG/ML IJ SOLN
15.0000 mg | Freq: Once | INTRAMUSCULAR | Status: AC
Start: 2022-05-01 — End: 2022-05-01
  Administered 2022-05-01: 15 mg via INTRAVENOUS
  Filled 2022-05-01: qty 1

## 2022-05-01 NOTE — ED Provider Notes (Signed)
[facility] Provider Note    Event Date/Time   First MD Initiated Contact with Patient 05/01/22 1219     (approximate)   History   Neck Pain   HPI  Kathleen Mcdaniel is a 36 y.o. female presenting to the emergency department for treatment and evaluation of neck pain. Pain started last week and got worse last night.  Pain is in the back of her neck and radiates around the right side and into her face and head. Her face feels "puffy."  No visual changes.  No injury.  No previously diagnosed chronic illness.  She has taken some ibuprofen without improvement.  No fever.  She reports that she has some sort of surgery on the front of her neck as a child but is unsure what the surgery was.  She takes no medications.  History reviewed. No pertinent past medical history.   Physical Exam   Triage Vital Signs: ED Triage Vitals  Enc Vitals Group     BP 05/01/22 1209 (!) 130/91     Pulse Rate 05/01/22 1209 92     Resp 05/01/22 1209 17     Temp 05/01/22 1209 98.2 F (36.8 C)     Temp Source 05/01/22 1209 Oral     SpO2 05/01/22 1209 95 %     Weight --      Height 05/01/22 1207 5\' 6"  (1.676 m)     Head Circumference --      Peak Flow --      Pain Score 05/01/22 1207 9     Pain Loc --      Pain Edu? --      Excl. in GC? --     Most recent vital signs: Vitals:   05/01/22 1209  BP: (!) 130/91  Pulse: 92  Resp: 17  Temp: 98.2 F (36.8 C)  SpO2: 95%    General: Awake, no distress.  CV:  Good peripheral perfusion.  Resp:  Normal effort.  Abd:  No distention.  Other:  Pupils equal, round, reactive to light.  Upper and lower extremity strength is 5/5 bilaterally. Dorsocervical fat pad. Tenderness to lateral right neck. No obvious goiter.    ED Results / Procedures / Treatments   Labs (all labs ordered are listed, but only abnormal results are displayed) Labs Reviewed  BASIC METABOLIC PANEL - Abnormal; Notable for the following components:      Result  Value   Glucose, Bld 106 (*)    Anion gap 4 (*)    All other components within normal limits  CBC WITH DIFFERENTIAL/PLATELET  TSH  T4, FREE  T3, FREE     EKG  Not indicated.   RADIOLOGY  Not indicated.  I have independently reviewed and interpreted imaging as well as reviewed report from radiology.  PROCEDURES:  Critical Care performed: No  Procedures   MEDICATIONS ORDERED IN ED:  Medications  ketorolac (TORADOL) 30 MG/ML injection 15 mg (15 mg Intravenous Given 05/01/22 1314)     IMPRESSION / MDM / ASSESSMENT AND PLAN / ED COURSE   I reviewed the triage vital signs and the nursing notes.  Differential diagnosis includes, but is not limited to: Acute torticollis, migraine, Hashimoto's, Cushings,   Patient's presentation is most consistent with acute complicated illness / injury requiring diagnostic workup.  36 year old female presenting to the emergency department for treatment and evaluation of neck pain and related symptoms as described in the HPI.  Because she is unable to really  tell me what type of neck surgery she had as a child, will get thyroid screening panel since she has radicular neck pain and headache.  Headache resolved after Toradol.  She states that she still feels pressure and tenderness in her neck with movement.  Labs are overall reassuring including thyroid screening.  Plan will be to treat her with NSAIDs and Robaxin.  She will be encouraged to follow-up with primary care if symptoms or not improving over the next 2 days.  If symptoms change or worsen and she is unable to be evaluated by primary care, she is to return to the emergency department.     FINAL CLINICAL IMPRESSION(S) / ED DIAGNOSES   Final diagnoses:  Acute neck pain     Rx / DC Orders   ED Discharge Orders          Ordered    methocarbamol (ROBAXIN) 500 MG tablet  4 times daily        05/01/22 1430    naproxen (NAPROSYN) 500 MG tablet  2 times daily with meals         05/01/22 1430             Note:  This document was prepared using Dragon voice recognition software and may include unintentional dictation errors.   Chinita Pester, FNP 05/01/22 1432    Shaune Pollack, MD 05/01/22 2111

## 2022-05-01 NOTE — Discharge Instructions (Signed)
Your blood work today is normal.  Take these medications as prescribed.  If your symptoms are not gone within 5 to 7 days, please see your primary care provider.  If your symptoms change or get worse and you are unable to schedule an appointment, please return to the emergency department.

## 2022-05-01 NOTE — ED Notes (Signed)
Pt to ED for painful swelling to posterior neck between shoulder blades, present since 1 week. Denies known injury or insect bites. Area is warm to touch, reddened and swollen. Pt states pain is now moving into R shoulder and into R face and below R eye.

## 2022-05-01 NOTE — ED Triage Notes (Signed)
Patient to ER via [facility] with complaints of neck pain that started last night. Reports the pain has been coming and going for the last week, pain worsened last night and now radiates into right shoulder. Swelling present.

## 2022-05-01 NOTE — ED Notes (Signed)
Pt attempted to sign for d/c paperwork and education but topaz frozen.  

## 2022-05-03 LAB — T3, FREE: T3, Free: 2.6 pg/mL (ref 2.0–4.4)

## 2022-08-10 ENCOUNTER — Ambulatory Visit: Payer: Self-pay | Admitting: Nurse Practitioner

## 2022-08-10 DIAGNOSIS — Z113 Encounter for screening for infections with a predominantly sexual mode of transmission: Secondary | ICD-10-CM

## 2022-08-10 LAB — WET PREP FOR TRICH, YEAST, CLUE
Trichomonas Exam: NEGATIVE
Yeast Exam: NEGATIVE

## 2022-08-10 NOTE — Progress Notes (Signed)
[facility] Department  STI clinic/screening visit [address] [place] [address] [phone]  Subjective:  Kathleen Mcdaniel is a 36 y.o. female being seen today for an STI screening visit. The patient reports they do have symptoms.  Patient reports that they do not desire a pregnancy in the next year.   They reported they are not interested in discussing contraception today.  Patient has had a tubal ligation.     Patient's last menstrual period was 06/05/2022 (approximate).   Patient has the following medical conditions:   Patient Active Problem List   Diagnosis Date Noted   Contact with and (suspected) exposure to viral hepatitis 02/04/2022   History of tubal ligation 2015 07/14/2020    Chief Complaint  Patient presents with   SEXUALLY TRANSMITTED DISEASE    Screening for STIs-vaginal discharge x 3 days, yellowish.  Lower abd. Pain.    HPI  Patient reports  to clinic today for STD screening.  Patient reports lower abdominal pain, discharge, and spotting for 2-3 days.   Does the patient using douching products? No  Last HIV test per patient/review of record was  Mcdaniel Results  Component Value Date   HMHIVSCREEN Negative - Validated 02/04/2022    Patient reports last pap was: unsure   Screening for MPX risk: Does the patient have an unexplained rash? No Is the patient MSM? No Does the patient endorse multiple sex partners or anonymous sex partners? No  Did the patient have close or sexual contact with a person diagnosed with MPX? No Has the patient traveled outside the [place] where MPX is endemic? No Is there a high clinical suspicion for MPX-- evidenced by one of the following No  -Unlikely to be chickenpox  -Lymphadenopathy  -Rash that present in same phase of evolution on any given body part See flowsheet for further details and programmatic requirements.   Immunization history:   There is no immunization history on file for this patient.    The following portions of the patient's history were reviewed and updated as appropriate: allergies, current medications, past medical history, past social history, past surgical history and problem list.  Objective:  There were no vitals filed for this visit.  Physical Exam Constitutional:      Appearance: Normal appearance.  HENT:     Head: Normocephalic. No abrasion, masses or laceration. Hair is normal.     Right Ear: External ear normal.     Left Ear: External ear normal.     Nose: Nose normal.     Mouth/Throat:     Lips: Pink.     Mouth: Mucous membranes are moist. No oral lesions.     Pharynx: No oropharyngeal exudate or posterior oropharyngeal erythema.     Tonsils: No tonsillar exudate or tonsillar abscesses.  Eyes:     General: Lids are normal.        Right eye: No discharge.        Left eye: No discharge.     Conjunctiva/sclera: Conjunctivae normal.     Right eye: No exudate.    Left eye: No exudate. Chest:       Comments: 0.5 cm non-tender nodule noted at 12 o'clock to left breast Abdominal:     General: Abdomen is flat.     Palpations: Abdomen is soft.     Tenderness: There is no abdominal tenderness. There is no rebound.  Genitourinary:    Pubic Area: No rash or pubic lice.  Labia:        Right: No rash, tenderness, lesion or injury.        Left: No rash, tenderness, lesion or injury.      Vagina: Normal. No vaginal discharge, erythema or lesions.     Cervix: No cervical motion tenderness, discharge, lesion or erythema.     Uterus: Not enlarged and not tender.      Rectum: Normal.     Comments: Amount Discharge: small  Odor: Yes pH: greater than 4.5 Adheres to vaginal wall: No Color: brownish   Musculoskeletal:     Cervical back: Full passive range of motion without pain, normal range of motion and neck supple.  Lymphadenopathy:     Cervical: No cervical adenopathy.     Right cervical: No superficial, deep or posterior cervical adenopathy.     Left cervical: No superficial, deep or posterior cervical adenopathy.     Upper Body:     Right upper body: No supraclavicular, axillary or epitrochlear adenopathy.     Left upper body: No supraclavicular, axillary or epitrochlear adenopathy.     Lower Body: No right inguinal adenopathy. No left inguinal adenopathy.  Skin:    General: Skin is warm and dry.     Findings: No lesion or rash.  Neurological:     Mental Status: She is alert and oriented to person, place, and time.  Psychiatric:        Attention and Perception: Attention normal.        Mood and Affect: Mood normal.        Speech: Speech normal.        Behavior: Behavior normal. Behavior is cooperative.      Assessment and Plan:  Kathleen Mcdaniel is a 36 y.o. female presenting to the [facility] Department for STI screening  1. Screening examination for venereal disease -36 year old female in clinic today for STD screening.  -Patient accepted all screenings including oral, vaginal CT/GC, wet prep and bloodwork for HIV/RPR.  Patient meets criteria for HepB screening? No. Ordered? No - low risk Patient meets criteria for HepC screening? No. Ordered? No - low risk   Treat wet prep per standing order Discussed time line for State Mcdaniel results and that patient will be called with positive results and encouraged patient to call if she had not heard in 2 weeks.  Counseled to return or seek care for continued or worsening symptoms Recommended condom use with all sex  Patient is currently not using  contraception  to prevent pregnancy.  Patient had a tubal ligation.  -0.5 cm non-tender nodule noted at 12 o'clock to left breast.  Patient first noticed nodule around one week ago.  Referral submitted to [facility] for evaluation.   - Chlamydia/Gonorrhea [place] Mcdaniel - Chlamydia/Gonorrhea [place] Mcdaniel - WET PREP FOR TRICH, YEAST, CLUE    Total time spent: 30 minutes    Return if symptoms worsen or fail to  improve.    Glenna Fellows, FNP

## 2022-08-10 NOTE — Progress Notes (Unsigned)
Pt here for STI screening.  Wet mount results reviewed with patient.  No treatment needed as per standing orders.  Condoms provided.-Forest Becker, RN

## 2022-09-01 ENCOUNTER — Other Ambulatory Visit: Payer: Self-pay

## 2022-09-01 DIAGNOSIS — N6320 Unspecified lump in the left breast, unspecified quadrant: Secondary | ICD-10-CM

## 2022-09-06 ENCOUNTER — Other Ambulatory Visit: Payer: Self-pay

## 2022-09-06 ENCOUNTER — Ambulatory Visit: Payer: Self-pay

## 2022-09-06 ENCOUNTER — Inpatient Hospital Stay: Admission: RE | Admit: 2022-09-06 | Payer: Self-pay | Source: Ambulatory Visit

## 2022-10-25 ENCOUNTER — Ambulatory Visit: Payer: Self-pay | Attending: Hematology and Oncology

## 2022-10-25 ENCOUNTER — Inpatient Hospital Stay: Admission: RE | Admit: 2022-10-25 | Payer: Self-pay | Source: Ambulatory Visit

## 2022-10-25 ENCOUNTER — Telehealth: Payer: Self-pay | Admitting: Hematology and Oncology

## 2022-10-25 ENCOUNTER — Other Ambulatory Visit: Payer: Self-pay

## 2022-11-09 ENCOUNTER — Encounter: Payer: Self-pay | Admitting: Nurse Practitioner

## 2022-11-09 NOTE — Progress Notes (Signed)
Message sent through Epic from [facility] coordinator to inform that patient had missed appt and was unable to reach by phone.  Telephone call to patient today informing of missed appt.  Patient states she recently moved and does not have transportation.  Advised patient to contact office to reschedule appt. Stressed the importance of appt.  Patient provided with number and will call to schedule. Gregary Cromer, FNP

## 2023-06-15 ENCOUNTER — Ambulatory Visit (LOCAL_COMMUNITY_HEALTH_CENTER): Payer: Self-pay

## 2023-06-15 VITALS — BP 125/77 | Ht 66.0 in | Wt 185.5 lb

## 2023-06-15 DIAGNOSIS — Z3202 Encounter for pregnancy test, result negative: Secondary | ICD-10-CM

## 2023-06-15 DIAGNOSIS — Z3009 Encounter for other general counseling and advice on contraception: Secondary | ICD-10-CM

## 2023-06-15 LAB — PREGNANCY, URINE: Preg Test, Ur: NEGATIVE

## 2023-06-15 NOTE — Progress Notes (Signed)
UPT negative. Provided w/ resource list and encouraged to set up an appointment to f/u on symptoms experiencing. Right breast leaking milk and breast tenderness .Stated her last period was very heavy and that is unusual for her No c/o pain .Stated having cravings/increase in appetite. Tubal ligation 45yrs ago.

## 2023-08-24 ENCOUNTER — Emergency Department
Admission: EM | Admit: 2023-08-24 | Discharge: 2023-08-24 | Disposition: A | Discharge status: Home / Self Care | Payer: Self-pay | Attending: Emergency Medicine | Admitting: Emergency Medicine

## 2023-08-24 ENCOUNTER — Encounter: Payer: Self-pay | Admitting: Emergency Medicine

## 2023-08-24 ENCOUNTER — Other Ambulatory Visit: Payer: Self-pay

## 2023-08-24 DIAGNOSIS — G44209 Tension-type headache, unspecified, not intractable: Secondary | ICD-10-CM | POA: Insufficient documentation

## 2023-08-24 DIAGNOSIS — E65 Localized adiposity: Secondary | ICD-10-CM

## 2023-08-24 DIAGNOSIS — M794 Hypertrophy of (infrapatellar) fat pad: Secondary | ICD-10-CM | POA: Insufficient documentation

## 2023-08-24 LAB — CBC WITH DIFFERENTIAL/PLATELET
Abs Immature Granulocytes: 0.02 10*3/uL (ref 0.00–0.07)
Basophils Absolute: 0.1 10*3/uL (ref 0.0–0.1)
Basophils Relative: 1 %
Eosinophils Absolute: 0.2 10*3/uL (ref 0.0–0.5)
Eosinophils Relative: 3 %
HCT: 43.5 % (ref 36.0–46.0)
Hemoglobin: 14 g/dL (ref 12.0–15.0)
Immature Granulocytes: 0 %
Lymphocytes Relative: 25 %
Lymphs Abs: 1.8 10*3/uL (ref 0.7–4.0)
MCH: 30 pg (ref 26.0–34.0)
MCHC: 32.2 g/dL (ref 30.0–36.0)
MCV: 93.1 fL (ref 80.0–100.0)
Monocytes Absolute: 0.3 10*3/uL (ref 0.1–1.0)
Monocytes Relative: 5 %
Neutro Abs: 4.7 10*3/uL (ref 1.7–7.7)
Neutrophils Relative %: 66 %
Platelets: 210 10*3/uL (ref 150–400)
RBC: 4.67 MIL/uL (ref 3.87–5.11)
RDW: 13 % (ref 11.5–15.5)
WBC: 7.1 10*3/uL (ref 4.0–10.5)
nRBC: 0 % (ref 0.0–0.2)

## 2023-08-24 LAB — BASIC METABOLIC PANEL
Anion gap: 8 (ref 5–15)
BUN: 10 mg/dL (ref 6–20)
CO2: 24 mmol/L (ref 22–32)
Calcium: 8.6 mg/dL — ABNORMAL LOW (ref 8.9–10.3)
Chloride: 106 mmol/L (ref 98–111)
Creatinine, Ser: 0.74 mg/dL (ref 0.44–1.00)
GFR, Estimated: >60 mL/min (ref >60)
Glucose, Bld: 91 mg/dL (ref 70–99)
Potassium: 3.8 mmol/L (ref 3.5–5.1)
Sodium: 138 mmol/L (ref 135–145)

## 2023-08-24 LAB — T4, FREE: Free T4: 0.76 ng/dL (ref 0.61–1.12)

## 2023-08-24 LAB — TSH: TSH: 2.422 u[IU]/mL (ref 0.350–4.500)

## 2023-08-24 MED ORDER — CYCLOBENZAPRINE HCL 10 MG PO TABS
10.0000 mg | ORAL_TABLET | Freq: Once | ORAL | Status: AC
  Administered 2023-08-24: 10 mg via ORAL
  Filled 2023-08-24: qty 1

## 2023-08-24 MED ORDER — CYCLOBENZAPRINE HCL 5 MG PO TABS: 5.0000 mg | ORAL_TABLET | Freq: Three times a day (TID) | ORAL | 0 refills | Status: AC | PRN

## 2023-08-24 MED ORDER — NAPROXEN 500 MG PO TABS
500.0000 mg | ORAL_TABLET | Freq: Once | ORAL | Status: AC
  Administered 2023-08-24: 500 mg via ORAL
  Filled 2023-08-24: qty 1

## 2023-08-24 MED ORDER — PREDNISONE 20 MG PO TABS: 40.0000 mg | ORAL_TABLET | Freq: Every day | ORAL | 0 refills | Status: AC

## 2023-08-24 NOTE — Discharge Instructions (Addendum)
Take the prescription meds as directed. Follow-up with one of the clinics listed below.   Please go to the following website to schedule new (and existing) patient appointments:   [email or web address]   The following is a list of primary care offices in the area who are accepting new patients at this time.  Please reach out to one of them directly and let them know you would like to schedule an appointment to follow up on an Emergency Department visit, and/or to establish a new primary care provider (PCP).  There are likely other primary care clinics in the are who are accepting new patients, but this is an excellent place to start:  [facility] Lead physician: Dr Shirlee Latch [address] [place], [place] [address] [phone]  [facility] Lead Physician: Dr Alba Cory [address], [place], [place] [address] [phone]  [facility]  Lead Physician: Dr Olevia Perches [address] [place], [place] [address] [phone]  [facility] Lead Physician: Dr Sofie Hartigan [address] [place], [place] [address] [phone]  [facility] at [facility] Lead Physician: Dr Bari Edward [address] [place], [place], [place] [address] [phone]

## 2023-08-24 NOTE — ED Notes (Signed)
ED Provider at bedside. 

## 2023-08-24 NOTE — ED Provider Notes (Signed)
[facility] Emergency Department Provider Note     Event Date/Time   First MD Initiated Contact with Patient 08/24/23 1510     (approximate)   History   Headache and Abscess   HPI  Kathleen Mcdaniel is a 37 y.o. female with a non-contributory medical history, presents to the ED for evaluation of a chronic "knot" to the back of the neck. The area is now increased in size and is tender, causing head and neck discomfort. She denies any spontaneous drainage, redness, FCS. She would endorse episodic nausea and vomiting, two episodes since Saturday.  Chart review reveals the patient had a similar visit in presentation last year.  She had a stable workup at that time, and was advised to follow-up with primary provider.   Physical Exam   Triage Vital Signs: ED Triage Vitals  Encounter Vitals Group     BP 08/24/23 1256 117/79     Systolic BP Percentile --      Diastolic BP Percentile --      Pulse Rate 08/24/23 1254 76     Resp 08/24/23 1254 17     Temp 08/24/23 1254 97.8 F (36.6 C)     Temp Source 08/24/23 1254 Oral     SpO2 08/24/23 1254 97 %     Weight 08/24/23 1256 190 lb (86.2 kg)     Height 08/24/23 1256 5\' 6"  (1.676 m)     Head Circumference --      Peak Flow --      Pain Score 08/24/23 1301 9     Pain Loc --      Pain Education --      Exclude from Growth Chart --     Most recent vital signs: Vitals:   08/24/23 1730 08/24/23 1745  BP: 120/86   Pulse: 72 84  Resp:    Temp:    SpO2: 98% 100%    General Awake, no distress. NAD HEENT NCAT. PERRL. EOMI. No rhinorrhea. Mucous membranes are moist.  TMs intact bilaterally.  Normal TMJ function with a mild click noted.  No focal dental abscess, pointing, fluctuance noted.  Uvula is midline and tonsils are flat.  Thyroid is nonpalpable. CV:  Good peripheral perfusion.  RESP:  Normal effort.  ABD:  No distention.  MSK:  Normal active range of motion of all extremities.  Normal spinal alignment  without midline tenderness, spasm, vomiting, or step-off.  Patient does have a dorsal cervical fat pad stable at this time.  No concerning skin changes overlying.   ED Results / Procedures / Treatments   Labs (all labs ordered are listed, but only abnormal results are displayed) Labs Reviewed  BASIC METABOLIC PANEL - Abnormal; Notable for the following components:      Result Value   Calcium 8.6 (*)    All other components within normal limits  CBC WITH DIFFERENTIAL/PLATELET  TSH  T4, FREE     EKG   RADIOLOGY   No results found.   PROCEDURES:  Critical Care performed: No  Procedures   MEDICATIONS ORDERED IN ED: Medications  naproxen (NAPROSYN) tablet 500 mg (500 mg Oral Given 08/24/23 1803)  cyclobenzaprine (FLEXERIL) tablet 10 mg (10 mg Oral Given 08/24/23 1803)     IMPRESSION / MDM / ASSESSMENT AND PLAN / ED COURSE  I reviewed the triage vital signs and the nursing notes.  Differential diagnosis includes, but is not limited to, keloid, sebaceous cyst, infected dermoid cyst, boil, abscess, buffalo hump, thyroid irregularity, musculoskeletal pain  Patient's presentation is most consistent with acute, uncomplicated illness.  Patient's diagnosis is consistent with acute tension type headache and chronic stable dorsocervical fat pad.  With reassuring exam and workup at this time.  No acute neuromuscular deficits appreciated.  No evidence of any acute infectious process of the oropharynx.  Vital signs are stable and reassuring.  Lab workup is also without any abnormalities noted.  Normal thyroid function is again demonstrated.  Patient will be discharged home with prescriptions for prednisone and Flexeril for pain management. Patient is to follow up with a local primary provider on the list given, as discussed, as needed or otherwise directed. Patient is given ED precautions to return to the ED for any worsening or new symptoms.   FINAL  CLINICAL IMPRESSION(S) / ED DIAGNOSES   Final diagnoses:  Acute non intractable tension-type headache  Dorsocervical fat pad     Rx / DC Orders   ED Discharge Orders          Ordered    cyclobenzaprine (FLEXERIL) 5 MG tablet  3 times daily PRN        08/24/23 1800    predniSONE (DELTASONE) 20 MG tablet  Daily with breakfast        08/24/23 1800             Note:  This document was prepared using Dragon voice recognition software and may include unintentional dictation errors.    Lissa Hoard, PA-C 08/24/23 2316    Concha Se, MD 08/25/23 [phone]

## 2023-08-24 NOTE — ED Triage Notes (Signed)
Pt complains of a "knot" on the back neck x 2 months. No redness or draining. Pt states 5 days ago noticed knot was larger and started to have head and neck pain.
